# Patient Record
Sex: Female | Born: 1985 | Race: Black or African American | Hispanic: No | Marital: Married | Smoking: Never smoker
Health system: Southern US, Community
[De-identification: ages and names within clinical notes are randomized; demographics above are authoritative.]

## PROBLEM LIST (undated history)

## (undated) ENCOUNTER — Inpatient Hospital Stay (HOSPITAL_COMMUNITY): Payer: Self-pay

## (undated) DIAGNOSIS — Z789 Other specified health status: Secondary | ICD-10-CM

## (undated) HISTORY — PX: NO PAST SURGERIES: SHX2092

---

## 2003-12-17 ENCOUNTER — Emergency Department (HOSPITAL_COMMUNITY): Admission: EM | Admit: 2003-12-17 | Discharge: 2003-12-17 | Payer: Self-pay | Admitting: Emergency Medicine

## 2007-02-25 ENCOUNTER — Encounter: Admission: RE | Admit: 2007-02-25 | Discharge: 2007-02-25 | Payer: Self-pay | Admitting: Family Medicine

## 2011-10-08 LAB — OB RESULTS CONSOLE RUBELLA ANTIBODY, IGM: Rubella: IMMUNE

## 2011-10-15 LAB — OB RESULTS CONSOLE GC/CHLAMYDIA
Chlamydia: NEGATIVE
Gonorrhea: NEGATIVE

## 2012-02-03 ENCOUNTER — Inpatient Hospital Stay (HOSPITAL_COMMUNITY): Admission: AD | Admit: 2012-02-03 | Payer: Self-pay | Source: Ambulatory Visit | Admitting: Obstetrics and Gynecology

## 2012-02-20 NOTE — L&D Delivery Note (Signed)
Delivery Note At 6:00 PM a healthy female was delivered via Vaginal, Spontaneous Delivery (Presentation: Left Occiput Anterior).  1 loose Percy. APGAR: 9, 9; weight .   Placenta status: complete.  Cord: 3 vessels with the following complications: None.  Cord pH: not done  Anesthesia: Epidural  Episiotomy: None Lacerations: 2nd degree;Perineal Suture Repair: vicryl rapide Est. Blood Loss (mL): 350  Mom to postpartum.  Baby to nursery-stable.  Crystalina Stodghill,MARIE-LYNE 05/19/2012, 6:20 PM

## 2012-05-13 ENCOUNTER — Encounter (HOSPITAL_COMMUNITY): Payer: Self-pay | Admitting: *Deleted

## 2012-05-13 ENCOUNTER — Inpatient Hospital Stay (HOSPITAL_COMMUNITY)
Admission: AD | Admit: 2012-05-13 | Discharge: 2012-05-13 | Disposition: A | Discharge status: Home / Self Care | Payer: 59 | Source: Ambulatory Visit | Attending: Obstetrics and Gynecology | Admitting: Obstetrics and Gynecology

## 2012-05-13 DIAGNOSIS — R030 Elevated blood-pressure reading, without diagnosis of hypertension: Secondary | ICD-10-CM | POA: Insufficient documentation

## 2012-05-13 DIAGNOSIS — O99891 Other specified diseases and conditions complicating pregnancy: Secondary | ICD-10-CM | POA: Insufficient documentation

## 2012-05-13 HISTORY — DX: Other specified health status: Z78.9

## 2012-05-13 LAB — URINALYSIS, ROUTINE W REFLEX MICROSCOPIC
Glucose, UA: NEGATIVE mg/dL
Protein, ur: NEGATIVE mg/dL

## 2012-05-13 LAB — CBC
Platelets: 290 10*3/uL (ref 150–400)
RBC: 4.1 MIL/uL (ref 3.87–5.11)
WBC: 12.2 10*3/uL — ABNORMAL HIGH (ref 4.0–10.5)

## 2012-05-13 LAB — COMPREHENSIVE METABOLIC PANEL
AST: 20 U/L (ref 0–37)
Albumin: 2.6 g/dL — ABNORMAL LOW (ref 3.5–5.2)
Alkaline Phosphatase: 212 U/L — ABNORMAL HIGH (ref 39–117)
Chloride: 99 mEq/L (ref 96–112)
Potassium: 4.4 mEq/L (ref 3.5–5.1)
Total Bilirubin: 0.2 mg/dL — ABNORMAL LOW (ref 0.3–1.2)

## 2012-05-13 LAB — URINE MICROSCOPIC-ADD ON

## 2012-05-13 LAB — URIC ACID: Uric Acid, Serum: 5.3 mg/dL (ref 2.4–7.0)

## 2012-05-13 NOTE — MAU Note (Signed)
Pt was seen at the office and found out that her BP was elevated.Pt was sent to MAU for further evaluation

## 2012-05-13 NOTE — MAU Provider Note (Signed)
History    27 yo G1P0 MBF now @ 39 5/[redacted] weeks gestation presented for further evaluation after routine office visit showed BP 150/98, 30 proteinuria. (+0 3lb weight gain since last visit. Patient denied epigastric pain or visual disturbance (+) FM Pt sent for serial BP, PIH labs and monitoring  Chief Complaint  Patient presents with  . Hypertension     OB History   Grav Para Term Preterm Abortions TAB SAB Ect Mult Living   1               Past Medical History  Diagnosis Date  . Medical history non-contributory     Past Surgical History  Procedure Laterality Date  . No past surgeries      Family History  Problem Relation Age of Onset  . Diabetes Mother   . Asthma Father   . Hypertension Sister   . Fibroids Sister     History  Substance Use Topics  . Smoking status: Not on file  . Smokeless tobacco: Not on file  . Alcohol Use: Not on file    Allergies: No Known Allergies  Prescriptions prior to admission  Medication Sig Dispense Refill  . cephALEXin (KEFLEX) 250 MG capsule Take 250 mg by mouth at bedtime. Continue until delivery for infection      . Evening Primrose topical oil Place 1 applicator vaginally at bedtime.      . pantoprazole (PROTONIX) 40 MG tablet Take 40 mg by mouth at bedtime.      . Prenatal Vit-Fe Fumarate-FA (MULTIVITAMIN-PRENATAL) 27-0.8 MG TABS Take 1 tablet by mouth daily at 12 noon.         Physical Exam   Blood pressure 125/84, pulse 93, temperature 99 F (37.2 C), temperature source Oral, last menstrual period 08/10/2011.  No exam performed today, done in office. Tracing: baseline 140, (+) accels 160-165 moderate variability. Reactive  ED Course  A/P  Transient elevated BP P) PIH warning signs. Labor prec. F/u office 1 week MDM   Serita Kyle, MD 6:32 PM 05/13/2012

## 2012-05-14 LAB — URINE CULTURE: Colony Count: NO GROWTH

## 2012-05-19 ENCOUNTER — Inpatient Hospital Stay (HOSPITAL_COMMUNITY): Payer: 59 | Admitting: Anesthesiology

## 2012-05-19 ENCOUNTER — Encounter (HOSPITAL_COMMUNITY): Payer: Self-pay

## 2012-05-19 ENCOUNTER — Encounter (HOSPITAL_COMMUNITY): Payer: Self-pay | Admitting: Anesthesiology

## 2012-05-19 ENCOUNTER — Inpatient Hospital Stay (HOSPITAL_COMMUNITY)
Admission: AD | Admit: 2012-05-19 | Discharge: 2012-05-21 | DRG: 775 | Disposition: A | Discharge status: Home / Self Care | Payer: 59 | Source: Ambulatory Visit | Attending: Obstetrics and Gynecology | Admitting: Obstetrics and Gynecology

## 2012-05-19 DIAGNOSIS — O99892 Other specified diseases and conditions complicating childbirth: Secondary | ICD-10-CM | POA: Diagnosis present

## 2012-05-19 DIAGNOSIS — IMO0001 Reserved for inherently not codable concepts without codable children: Secondary | ICD-10-CM

## 2012-05-19 DIAGNOSIS — Z22330 Carrier of Group B streptococcus: Secondary | ICD-10-CM

## 2012-05-19 DIAGNOSIS — O139 Gestational [pregnancy-induced] hypertension without significant proteinuria, unspecified trimester: Secondary | ICD-10-CM | POA: Diagnosis present

## 2012-05-19 LAB — CBC
HCT: 34.8 % — ABNORMAL LOW (ref 36.0–46.0)
MCH: 28.4 pg (ref 26.0–34.0)
MCHC: 34.5 g/dL (ref 30.0–36.0)
MCV: 82.3 fL (ref 78.0–100.0)
Platelets: 308 10*3/uL (ref 150–400)
RBC: 4.23 MIL/uL (ref 3.87–5.11)
RDW: 15 % (ref 11.5–15.5)
WBC: 17 10*3/uL — ABNORMAL HIGH (ref 4.0–10.5)

## 2012-05-19 LAB — COMPREHENSIVE METABOLIC PANEL
ALT: 12 U/L (ref 0–35)
AST: 21 U/L (ref 0–37)
Albumin: 2.9 g/dL — ABNORMAL LOW (ref 3.5–5.2)
Calcium: 9.8 mg/dL (ref 8.4–10.5)
Chloride: 98 mEq/L (ref 96–112)
Creatinine, Ser: 0.68 mg/dL (ref 0.50–1.10)
Sodium: 133 mEq/L — ABNORMAL LOW (ref 135–145)

## 2012-05-19 LAB — OB RESULTS CONSOLE ABO/RH: RH Type: NEGATIVE

## 2012-05-19 LAB — URINALYSIS, DIPSTICK ONLY
Bilirubin Urine: NEGATIVE
Glucose, UA: NEGATIVE mg/dL
Ketones, ur: NEGATIVE mg/dL
Specific Gravity, Urine: <1.005 — ABNORMAL LOW (ref 1.005–1.030)
pH: 6 (ref 5.0–8.0)

## 2012-05-19 LAB — RPR: RPR Ser Ql: NONREACTIVE

## 2012-05-19 LAB — TYPE AND SCREEN: Antibody Screen: NEGATIVE

## 2012-05-19 LAB — OB RESULTS CONSOLE ANTIBODY SCREEN: Antibody Screen: NEGATIVE

## 2012-05-19 MED ORDER — EPHEDRINE 5 MG/ML INJ
10.0000 mg | INTRAVENOUS | Status: DC | PRN
  Filled 2012-05-19: qty 2

## 2012-05-19 MED ORDER — LANOLIN HYDROUS EX OINT: TOPICAL_OINTMENT | CUTANEOUS | Status: DC | PRN

## 2012-05-19 MED ORDER — FLEET ENEMA 7-19 GM/118ML RE ENEM: 1.0000 | ENEMA | RECTAL | Status: DC | PRN

## 2012-05-19 MED ORDER — OXYTOCIN 40 UNITS IN LACTATED RINGERS INFUSION - SIMPLE MED
62.5000 mL/h | INTRAVENOUS | Status: DC
  Administered 2012-05-19: 62.5 mL/h via INTRAVENOUS
  Filled 2012-05-19: qty 1000

## 2012-05-19 MED ORDER — DIPHENHYDRAMINE HCL 50 MG/ML IJ SOLN: 12.5000 mg | INTRAMUSCULAR | Status: DC | PRN

## 2012-05-19 MED ORDER — IBUPROFEN 600 MG PO TABS
600.0000 mg | ORAL_TABLET | Freq: Four times a day (QID) | ORAL | Status: DC
  Administered 2012-05-20 – 2012-05-21 (×7): 600 mg via ORAL
  Filled 2012-05-19 (×8): qty 1

## 2012-05-19 MED ORDER — DIPHENHYDRAMINE HCL 25 MG PO CAPS
25.0000 mg | ORAL_CAPSULE | Freq: Four times a day (QID) | ORAL | Status: DC | PRN
  Filled 2012-05-19: qty 1

## 2012-05-19 MED ORDER — TETANUS-DIPHTH-ACELL PERTUSSIS 5-2.5-18.5 LF-MCG/0.5 IM SUSP: 0.5000 mL | Freq: Once | INTRAMUSCULAR | Status: DC

## 2012-05-19 MED ORDER — PHENYLEPHRINE 40 MCG/ML (10ML) SYRINGE FOR IV PUSH (FOR BLOOD PRESSURE SUPPORT)
80.0000 ug | PREFILLED_SYRINGE | INTRAVENOUS | Status: DC | PRN
  Filled 2012-05-19: qty 2

## 2012-05-19 MED ORDER — LACTATED RINGERS IV SOLN: 500.0000 mL | INTRAVENOUS | Status: DC | PRN

## 2012-05-19 MED ORDER — LIDOCAINE HCL (PF) 1 % IJ SOLN
30.0000 mL | INTRAMUSCULAR | Status: AC | PRN
  Administered 2012-05-19: 30 mL via SUBCUTANEOUS
  Filled 2012-05-19 (×2): qty 30

## 2012-05-19 MED ORDER — ACETAMINOPHEN 325 MG PO TABS: 650.0000 mg | ORAL_TABLET | ORAL | Status: DC | PRN

## 2012-05-19 MED ORDER — ONDANSETRON HCL 4 MG/2ML IJ SOLN: 4.0000 mg | Freq: Four times a day (QID) | INTRAMUSCULAR | Status: DC | PRN

## 2012-05-19 MED ORDER — BUTORPHANOL TARTRATE 1 MG/ML IJ SOLN
1.0000 mg | Freq: Once | INTRAMUSCULAR | Status: AC
  Administered 2012-05-19: 1 mg via INTRAVENOUS
  Filled 2012-05-19: qty 1

## 2012-05-19 MED ORDER — LACTATED RINGERS IV SOLN
INTRAVENOUS | Status: DC
  Administered 2012-05-19: 11:00:00 via INTRAVENOUS

## 2012-05-19 MED ORDER — LACTATED RINGERS IV SOLN: 500.0000 mL | Freq: Once | INTRAVENOUS | Status: DC

## 2012-05-19 MED ORDER — OXYCODONE-ACETAMINOPHEN 5-325 MG PO TABS: 1.0000 | ORAL_TABLET | ORAL | Status: DC | PRN

## 2012-05-19 MED ORDER — EPHEDRINE 5 MG/ML INJ
10.0000 mg | INTRAVENOUS | Status: DC | PRN
  Filled 2012-05-19: qty 2
  Filled 2012-05-19: qty 4

## 2012-05-19 MED ORDER — SIMETHICONE 80 MG PO CHEW: 80.0000 mg | CHEWABLE_TABLET | ORAL | Status: DC | PRN

## 2012-05-19 MED ORDER — OXYTOCIN BOLUS FROM INFUSION: 500.0000 mL | INTRAVENOUS | Status: DC

## 2012-05-19 MED ORDER — OXYTOCIN 40 UNITS IN LACTATED RINGERS INFUSION - SIMPLE MED: 62.5000 mL/h | INTRAVENOUS | Status: DC | PRN

## 2012-05-19 MED ORDER — FENTANYL 2.5 MCG/ML BUPIVACAINE 1/10 % EPIDURAL INFUSION (WH - ANES)
14.0000 mL/h | INTRAMUSCULAR | Status: DC | PRN
  Administered 2012-05-19: 14 mL/h via EPIDURAL
  Filled 2012-05-19: qty 125

## 2012-05-19 MED ORDER — LIDOCAINE HCL (PF) 1 % IJ SOLN
INTRAMUSCULAR | Status: DC | PRN
  Administered 2012-05-19 (×2): 5 mL

## 2012-05-19 MED ORDER — TERBUTALINE SULFATE 1 MG/ML IJ SOLN: 0.2500 mg | Freq: Once | INTRAMUSCULAR | Status: DC | PRN

## 2012-05-19 MED ORDER — ONDANSETRON HCL 4 MG PO TABS: 4.0000 mg | ORAL_TABLET | ORAL | Status: DC | PRN

## 2012-05-19 MED ORDER — OXYTOCIN 40 UNITS IN LACTATED RINGERS INFUSION - SIMPLE MED
1.0000 m[IU]/min | INTRAVENOUS | Status: DC
  Administered 2012-05-19: 2 m[IU]/min via INTRAVENOUS
  Administered 2012-05-19: 6 m[IU]/min via INTRAVENOUS

## 2012-05-19 MED ORDER — DIBUCAINE 1 % RE OINT: 1.0000 "application " | TOPICAL_OINTMENT | RECTAL | Status: DC | PRN

## 2012-05-19 MED ORDER — CITRIC ACID-SODIUM CITRATE 334-500 MG/5ML PO SOLN: 30.0000 mL | ORAL | Status: DC | PRN

## 2012-05-19 MED ORDER — WITCH HAZEL-GLYCERIN EX PADS: 1.0000 "application " | MEDICATED_PAD | CUTANEOUS | Status: DC | PRN

## 2012-05-19 MED ORDER — IBUPROFEN 600 MG PO TABS: 600.0000 mg | ORAL_TABLET | Freq: Four times a day (QID) | ORAL | Status: DC | PRN

## 2012-05-19 MED ORDER — PENICILLIN G POTASSIUM 5000000 UNITS IJ SOLR
5.0000 10*6.[IU] | Freq: Once | INTRAVENOUS | Status: AC
  Administered 2012-05-19: 5 10*6.[IU] via INTRAVENOUS
  Filled 2012-05-19: qty 5

## 2012-05-19 MED ORDER — PRENATAL MULTIVITAMIN CH
1.0000 | ORAL_TABLET | Freq: Every day | ORAL | Status: DC
  Administered 2012-05-20 – 2012-05-21 (×2): 1 via ORAL
  Filled 2012-05-19 (×3): qty 1

## 2012-05-19 MED ORDER — PENICILLIN G POTASSIUM 5000000 UNITS IJ SOLR
2.5000 10*6.[IU] | INTRAVENOUS | Status: DC
  Administered 2012-05-19 (×2): 2.5 10*6.[IU] via INTRAVENOUS
  Filled 2012-05-19 (×5): qty 2.5

## 2012-05-19 MED ORDER — PHENYLEPHRINE 40 MCG/ML (10ML) SYRINGE FOR IV PUSH (FOR BLOOD PRESSURE SUPPORT)
80.0000 ug | PREFILLED_SYRINGE | INTRAVENOUS | Status: DC | PRN
  Filled 2012-05-19: qty 5
  Filled 2012-05-19: qty 2

## 2012-05-19 MED ORDER — ZOLPIDEM TARTRATE 5 MG PO TABS: 5.0000 mg | ORAL_TABLET | Freq: Every evening | ORAL | Status: DC | PRN

## 2012-05-19 MED ORDER — BENZOCAINE-MENTHOL 20-0.5 % EX AERO: 1.0000 "application " | INHALATION_SPRAY | CUTANEOUS | Status: DC | PRN

## 2012-05-19 MED ORDER — SENNOSIDES-DOCUSATE SODIUM 8.6-50 MG PO TABS
2.0000 | ORAL_TABLET | Freq: Every day | ORAL | Status: DC
  Administered 2012-05-20: 2 via ORAL

## 2012-05-19 MED ORDER — ONDANSETRON HCL 4 MG/2ML IJ SOLN: 4.0000 mg | INTRAMUSCULAR | Status: DC | PRN

## 2012-05-19 NOTE — H&P (Signed)
Christina Wu is a 27 y.o. female G1 at 40.3 wks, well dated by LMP c/w sono. Here in active labor, contracting for a few days but stronger contractions since midnight. No bleeding or leaking per vagina. Good FMs.  [facility]- Dr Cherly Hensen.  Started [facility] in 1st trim, all labs normal, sono normal, Rh(-), Rhogam at 28 wks. GERD (Pantoprazole), GBS(+) Was sent from office last week to MAU for HTN evaluation, Preeclampsia ruled out. Pt denies HA/ vision changes/ RUQ or epigastric pain/ LE swelling or abn weight gain in last few wks.  History OB History   Grav Para Term Preterm Abortions TAB SAB Ect Mult Living   1 0 0 0 0 0 0 0 0 0      Past Medical History  Diagnosis Date  . Medical history non-contributory    Past Surgical History  Procedure Laterality Date  . No past surgeries     Family History: family history includes Asthma in her father; Diabetes in her mother; Fibroids in her sister; and Hypertension in her sister. Social History:  reports that she has never smoked. She does not have any smokeless tobacco history on file. She reports that she does not drink alcohol or use illicit drugs.   Prenatal Transfer Tool  Maternal Diabetes: No Genetic Screening: Normal AFP4 neg Maternal Ultrasounds/Referrals: Normal Fetal Ultrasounds or other Referrals:  None Maternal Substance Abuse:  No Significant Maternal Medications:  Meds include: Protonix Significant Maternal Lab Results:  Lab values include: Group B Strep positive, Rh negative  ROS denies HA/ vision changes/ RUQ or epigastric pain/ LE swelling or abn weight gain in last few wks.  Exam Physical Exam  Blood pressure 125/94, pulse 77, temperature 98.8 F (37.1 C), temperature source Oral, resp. rate 18, height 5\' 1"  (1.549 m), weight 211 lb 6.4 oz (95.89 kg). A&O x 3, no acute distress. Pleasant HEENT neg, no thyromegaly Lungs CTA bilat CV RRR, A1S2 normal Abdo soft, non tender, non acute Extr no edema/ tenderness Pelvic  above, is intact, will AROM after 2nd PCN infused. Dilation: 6, Effacement (%): 80, Station: -2 , Exam by:: Rudi Coco RN FHT 140s/ + accels/ no decels/ moderate variability/ reassuring Toco every 3-4 min, spontaneous.   Prenatal labs: ABO, Rh: --/--/O NEG (03/31 0548), Rhogam at 28 wks Antibody: NEG (03/31 0548) Rubella: Immune (08/19 0000) RPR: Nonreactive (08/19 0000)  HBsAg: Negative (08/19 0000)  HIV: Non-reactive (08/19 0000)  GBS: Positive (03/31 0000)  Glucola normal AFP4 neg Anatomy sono normal  Assessment/Plan: 27 yo, G1 at 40.3 wks, GBS(+), on PCN, Gest.HTN, check Preeclampsia labs again incl UA. No symptoms at present, desires IV pain meds for labor pains, Epidural if pain worse. Rh neg, Rhogam PP as indicated.  FHT category I, anticipate SVD, EFW 8 lbs  Christina Wu R 05/19/2012, 10:08 AM

## 2012-05-19 NOTE — Progress Notes (Addendum)
Christina Wu is a 27 y.o. G1P0000 at [redacted]w[redacted]d, admitted in active labor. GBS(+), 2nd PCN given.  S/p Epidural, comfortable now.  BP elevated, denies PEC s/s   Objective: BP 142/89  Pulse 98  Temp(Src) 98.6 F (37 C) (Oral)  Resp 20  Ht 5\' 1"  (1.549 m)  Wt 211 lb 6.4 oz (95.89 kg)  BMI 39.96 kg/m2  SpO2 97%    FHT:  FHR: 125 bpm, variability: moderate,  accelerations:  Present,  decelerations:  Absent UC:   regular, every 3 minutes SVE:   Dilation: 7.5 Effacement (%): 90 Station: -3 Exam by:: [employer] high, is OA. Continue to assess descent.   Labs: Lab Results  Component Value Date   WBC 17.0* 05/19/2012   HGB 12.0 05/19/2012   HCT 34.8* 05/19/2012   MCV 82.3 05/19/2012   PLT 308 05/19/2012  PIH labs pending.  Assessment / Plan: Spontaneous labor, progressing normally, but station is high. Assess progress especially descent.   Labor: Progressing normally Preeclampsia:  none Fetal Wellbeing:  Category I Pain Control:  Epidural Anticipated MOD:  NSVD, but guarded since station at -3.    Ruchi Stoney R 05/19/2012, 11:16 AM  PIH labs normal, urine protein -negative V.Juliene Pina, MD

## 2012-05-19 NOTE — Progress Notes (Signed)
Subjective: Doing well, pain controled by epidural, UCs q3 min  Anesthesia epidural   Objective: BP 144/89  Pulse 73  Temp(Src) 97.7 F (36.5 C) (Oral)  Resp 20  Ht 5\' 1"  (1.549 m)  Wt 95.89 kg (211 lb 6.4 oz)  BMI 39.96 kg/m2  SpO2 99%   FHT:  FHR: 130's bpm, variability: moderate,  accelerations:  Present,  decelerations:  Present occasional mild variable decelerations UC:   regular, every 3 minutes VE:   Dilation: 10 Effacement (%): 100 Station: +2 Exam by:: Tianne Plott Presentation turned digitally from Rt post occiput to Rt ant occiput   Assessment / Plan: 2nd phase of labor.  Pushing well.  Fetal Wellbeing:  Category I Pain Control:  Epidural  Anticipated MOD:  NSVD  Christian Borgerding,MARIE-LYNE 05/19/2012, 5:22 PM

## 2012-05-19 NOTE — Anesthesia Preprocedure Evaluation (Signed)
Anesthesia Evaluation  Patient identified by MRN, date of birth, ID band Patient awake    Reviewed: Allergy & Precautions, H&P , Patient's Chart, lab work & pertinent test results  Airway Mallampati: III TM Distance: >3 FB Neck ROM: full    Dental no notable dental hx.    Pulmonary neg pulmonary ROS,  breath sounds clear to auscultation  Pulmonary exam normal       Cardiovascular negative cardio ROS  Rhythm:regular Rate:Normal     Neuro/Psych negative neurological ROS  negative psych ROS   GI/Hepatic negative GI ROS, Neg liver ROS,   Endo/Other  negative endocrine ROSMorbid obesity  Renal/GU negative Renal ROS     Musculoskeletal   Abdominal   Peds  Hematology negative hematology ROS (+)   Anesthesia Other Findings   Reproductive/Obstetrics (+) Pregnancy                           Anesthesia Physical Anesthesia Plan  ASA: III  Anesthesia Plan: Epidural   Post-op Pain Management:    Induction:   Airway Management Planned:   Additional Equipment:   Intra-op Plan:   Post-operative Plan:   Informed Consent: I have reviewed the patients History and Physical, chart, labs and discussed the procedure including the risks, benefits and alternatives for the proposed anesthesia with the patient or authorized representative who has indicated his/her understanding and acceptance.     Plan Discussed with:   Anesthesia Plan Comments:         Anesthesia Quick Evaluation  

## 2012-05-19 NOTE — Anesthesia Procedure Notes (Signed)
Epidural Patient location during procedure: OB Start time: 05/19/2012 10:45 AM  Staffing Anesthesiologist: Angus Seller., Harrell Gave. Performed by: anesthesiologist   Preanesthetic Checklist Completed: patient identified, site marked, surgical consent, pre-op evaluation, timeout performed, IV checked, risks and benefits discussed and monitors and equipment checked  Epidural Patient position: sitting Prep: site prepped and draped and DuraPrep Patient monitoring: continuous pulse ox and blood pressure Approach: midline Injection technique: LOR air and LOR saline  Needle:  Needle type: Tuohy  Needle gauge: 17 G Needle length: 9 cm and 9 Needle insertion depth: 7 cm Catheter type: closed end flexible Catheter size: 19 Gauge Catheter at skin depth: 14 cm Test dose: negative  Assessment Events: blood not aspirated, injection not painful, no injection resistance, negative IV test and no paresthesia  Additional Notes Patient identified.  Risk benefits discussed including failed block, incomplete pain control, headache, nerve damage, paralysis, blood pressure changes, nausea, vomiting, reactions to medication both toxic or allergic, and postpartum back pain.  Patient expressed understanding and wished to proceed.  All questions were answered.  Sterile technique used throughout procedure and epidural site dressed with sterile barrier dressing. No paresthesia or other complications noted.The patient did not experience any signs of intravascular injection such as tinnitus or metallic taste in mouth nor signs of intrathecal spread such as rapid motor block. Please see nursing notes for vital signs.

## 2012-05-19 NOTE — Progress Notes (Signed)
Christina Wu is a 27 y.o. G1P0000 at [redacted]w[redacted]d in active labor. COmfortable with epidural  Objective: BP 130/72  Pulse 88  Temp(Src) 98.5 F (36.9 C) (Oral)  Resp 20  Ht 5\' 1"  (1.549 m)  Wt 211 lb 6.4 oz (95.89 kg)  BMI 39.96 kg/m2  SpO2 99%    FHT:  FHR: 125 bpm, variability: moderate,  accelerations:  Present,  decelerations:  Present occ variable decels UC:   irregular, every 3-5 minutes with coupling. Pitocin started at 11.30 am due to protracted active phase SVE:   Dilation: 8 Effacement (%): 90 Station: -2 Exam by:: [employer] OT now, will place in exaggerated Sims.   Labs: Lab Results  Component Value Date   WBC 17.0* 05/19/2012   HGB 12.0 05/19/2012   HCT 34.8* 05/19/2012   MCV 82.3 05/19/2012   PLT 308 05/19/2012   Assessment / Plan: Protracted active phase, Pitocin per protocol. PCN for GBS, s/p 2 doses, will continue q 4hrs  Labor: Protracted, with high station, reassess with pitocin on board Preeclampsia:  No evidence, BPs stable, no anti-HTN med given, labs nl.  Fetal Wellbeing:  Category I Pain Control:  Epidural Anticipated MOD:  Guarded for vaginal delivery since station remains high, continue pitocin and assess progress. Patient understands and has no questions.   Teasha Murrillo R 05/19/2012, 1:13 PM

## 2012-05-20 LAB — CBC
HCT: 30 % — ABNORMAL LOW (ref 36.0–46.0)
MCH: 27.9 pg (ref 26.0–34.0)
MCHC: 33.7 g/dL (ref 30.0–36.0)
MCV: 82.9 fL (ref 78.0–100.0)
Platelets: 256 10*3/uL (ref 150–400)
RDW: 15.3 % (ref 11.5–15.5)
WBC: 22.4 10*3/uL — ABNORMAL HIGH (ref 4.0–10.5)

## 2012-05-20 NOTE — Progress Notes (Signed)
PPD 1 SVD  S:  Reports feeling tired and sore             Tolerating po/ No nausea or vomiting             Bleeding is light             Pain controlled with motrin and percocet             Up ad lib / ambulatory / voiding QS  Newborn breast feeding  / Circumcision planned O:               VS: BP 133/88  Pulse 71  Temp(Src) 97.8 F (36.6 C) (Oral)  Resp 18  Ht 5\' 1"  (1.549 m)  Wt 95.89 kg (211 lb 6.4 oz)  BMI 39.96 kg/m2  SpO2 99%   LABS:  Recent Labs  05/19/12 0558 05/20/12 0605  WBC 17.0* 22.4*  HGB 12.0 10.1*  PLT 308 256    I&O: Intake/Output     03/31 0701 - 04/01 0700 04/01 0701 - 04/02 0700   Urine (mL/kg/hr) 700 (0.3)    Blood 350 (0.2)    Total Output 1050     Net -1050                       Physical Exam:             Alert and oriented X3  Lungs: Clear and unlabored  Heart: regular rate and rhythm / no mumurs  Abdomen: soft, non-tender, non-distended              Fundus: firm, non-tender, U+1 (needs to void / 4+ hours since last void)  Perineum: mild edema / ice in place  Lochia: light  Extremities: trace edema, no calf pain or tenderness   A: PPD # 1   Doing well - stable status  P:  Routine post partum orders             Encouraged void every 2-3 hours prior to urge    Marlinda Mike CNM, MSN 05/20/2012, 9:05 AM

## 2012-05-20 NOTE — Anesthesia Postprocedure Evaluation (Signed)
  Anesthesia Post-op Note  Patient: Research scientist (physical sciences)  Procedure(s) Performed: * No procedures listed *  Patient Location: Mother/Baby  Anesthesia Type:Epidural  Level of Consciousness: awake, alert , oriented and patient cooperative  Airway and Oxygen Therapy: Patient Spontanous Breathing  Post-op Pain: mild  Post-op Assessment: Patient's Cardiovascular Status Stable, Respiratory Function Stable, Patent Airway, No signs of Nausea or vomiting, Adequate PO intake and Pain level controlled  Post-op Vital Signs: Reviewed and stable  Complications: No apparent anesthesia complications

## 2012-05-21 MED ORDER — IBUPROFEN 600 MG PO TABS: 600.0000 mg | ORAL_TABLET | Freq: Four times a day (QID) | ORAL | Status: DC

## 2012-05-21 MED ORDER — RHO D IMMUNE GLOBULIN 1500 UNIT/2ML IJ SOLN
300.0000 ug | Freq: Once | INTRAMUSCULAR | Status: AC
  Administered 2012-05-21: 300 ug via INTRAMUSCULAR
  Filled 2012-05-21: qty 2

## 2012-05-21 MED ORDER — OXYCODONE-ACETAMINOPHEN 5-325 MG PO TABS: 1.0000 | ORAL_TABLET | ORAL | Status: DC | PRN

## 2012-05-21 NOTE — Discharge Summary (Signed)
Obstetric Discharge Summary  Reason for Admission: onset of labor Prenatal Procedures: none Intrapartum Procedures: spontaneous vaginal delivery and GBS prophylaxis Postpartum Procedures: none Complications-Operative and Postpartum: 2nd degree perineal laceration Hemoglobin  Date Value Range Status  05/20/2012 10.1* 12.0 - 15.0 g/dL Final     HCT  Date Value Range Status  05/20/2012 30.0* 36.0 - 46.0 % Final    Physical Exam:  General: alert, cooperative and no distress Lochia: appropriate Uterine Fundus: firm Incision: healing well DVT Evaluation: No evidence of DVT seen on physical exam.  Discharge Diagnoses: Term Pregnancy-delivered  Discharge Information: Date: 05/21/2012 Activity: pelvic rest Diet: routine Medications: PNV, Ibuprofen and Percocet Condition: stable Instructions: refer to practice specific booklet Discharge to: home Follow-up Information   Follow up with COUSINS,SHERONETTE A, MD. Schedule an appointment as soon as possible for a visit in 6 weeks.   Contact information:   [address] [place] [address] [phone]       Newborn Data: Live born female  Birth Weight: 6 lb 6.8 oz (2915 g) APGAR: 9, 9  Home with mother.  Christina Wu 05/21/2012, 8:28 AM

## 2012-05-21 NOTE — Progress Notes (Signed)
PPD 2 SVD  S:  Reports feeling really tired - BF all night             Tolerating po/ No nausea or vomiting             Bleeding is light             Pain controlled with motrin and percocet             Up ad lib / ambulatory / voiding QS  Newborn breast feeding  / Circumcision done this am  O:               VS: BP 136/84  Pulse 101  Temp(Src) 98.2 F (36.8 C) (Oral)  Resp 18  Ht 5\' 1"  (1.549 m)  Wt 95.89 kg (211 lb 6.4 oz)  BMI 39.96 kg/m2  SpO2 99%   LABS:  Recent Labs  05/19/12 0558 05/20/12 0605  WBC 17.0* 22.4*  HGB 12.0 10.1*  PLT 308 256                Physical Exam:             Alert and oriented X3  Abdomen: soft, non-tender, non-distended              Fundus: firm, non-tender, U-1  Perineum: mild edema  Lochia: light  Extremities: no edema, no calf pain or tenderness    A: PPD # 2   Doing well - stable status  P:  Routine post partum orders  Discharge home - [facility] booklet - instructions reviewed  Marlinda Mike CNM, MSN 05/21/2012, 8:25 AM

## 2012-05-22 LAB — RH IG WORKUP (INCLUDES ABO/RH)
Fetal Screen: NEGATIVE
Unit division: 0

## 2013-12-21 ENCOUNTER — Encounter (HOSPITAL_COMMUNITY): Payer: Self-pay

## 2015-10-12 ENCOUNTER — Other Ambulatory Visit: Payer: Self-pay | Admitting: Internal Medicine

## 2015-10-12 DIAGNOSIS — N63 Unspecified lump in unspecified breast: Secondary | ICD-10-CM

## 2015-10-18 ENCOUNTER — Other Ambulatory Visit: Payer: Self-pay

## 2015-12-14 LAB — OB RESULTS CONSOLE ABO/RH: RH Type: NEGATIVE

## 2015-12-14 LAB — OB RESULTS CONSOLE RUBELLA ANTIBODY, IGM: RUBELLA: IMMUNE

## 2015-12-14 LAB — OB RESULTS CONSOLE HIV ANTIBODY (ROUTINE TESTING): HIV: NONREACTIVE

## 2015-12-14 LAB — OB RESULTS CONSOLE RPR: RPR: NONREACTIVE

## 2015-12-14 LAB — OB RESULTS CONSOLE HEPATITIS B SURFACE ANTIGEN: Hepatitis B Surface Ag: NEGATIVE

## 2015-12-14 LAB — OB RESULTS CONSOLE ANTIBODY SCREEN: ANTIBODY SCREEN: NEGATIVE

## 2015-12-28 LAB — OB RESULTS CONSOLE GBS: STREP GROUP B AG: POSITIVE

## 2015-12-28 LAB — OB RESULTS CONSOLE GC/CHLAMYDIA
CHLAMYDIA, DNA PROBE: NEGATIVE
GC PROBE AMP, GENITAL: NEGATIVE

## 2016-02-20 NOTE — L&D Delivery Note (Signed)
Delivery Note At 3:44 PM a viable and healthy female was delivered via Vaginal, Spontaneous Delivery (Presentation:vtx;ROA  ).  APGAR: 7, 9; weight  7lb 14 oz .   Placenta status: spontaneous intact , .  Cord:  with the following complications:none .  Cord pH: none Shoulder dystocia resolved with mcRoberts, suprapubic pressure Anesthesia:  epidural Episiotomy: None Lacerations: 2nd degree perineal Suture Repair: 3.0 chromic Est. Blood Loss (mL): 300  Mom to postpartum.  Baby to Couplet care / Skin to Skin.  Christina Wu A 07/18/2016, 5:02 PM

## 2016-07-18 ENCOUNTER — Inpatient Hospital Stay (HOSPITAL_COMMUNITY): Payer: 59 | Admitting: Anesthesiology

## 2016-07-18 ENCOUNTER — Inpatient Hospital Stay (HOSPITAL_COMMUNITY)
Admission: AD | Admit: 2016-07-18 | Discharge: 2016-07-20 | DRG: 775 | Disposition: A | Discharge status: Home / Self Care | Payer: 59 | Source: Ambulatory Visit | Attending: Obstetrics and Gynecology | Admitting: Obstetrics and Gynecology

## 2016-07-18 ENCOUNTER — Encounter (HOSPITAL_COMMUNITY): Payer: Self-pay

## 2016-07-18 DIAGNOSIS — Z3493 Encounter for supervision of normal pregnancy, unspecified, third trimester: Secondary | ICD-10-CM | POA: Diagnosis present

## 2016-07-18 DIAGNOSIS — Z6841 Body Mass Index (BMI) 40.0 and over, adult: Secondary | ICD-10-CM | POA: Diagnosis not present

## 2016-07-18 DIAGNOSIS — O99824 Streptococcus B carrier state complicating childbirth: Principal | ICD-10-CM | POA: Diagnosis present

## 2016-07-18 DIAGNOSIS — Z6791 Unspecified blood type, Rh negative: Secondary | ICD-10-CM

## 2016-07-18 DIAGNOSIS — Z3A39 39 weeks gestation of pregnancy: Secondary | ICD-10-CM | POA: Diagnosis not present

## 2016-07-18 DIAGNOSIS — O26893 Other specified pregnancy related conditions, third trimester: Secondary | ICD-10-CM | POA: Diagnosis present

## 2016-07-18 DIAGNOSIS — O99214 Obesity complicating childbirth: Secondary | ICD-10-CM | POA: Diagnosis present

## 2016-07-18 LAB — CBC
HEMATOCRIT: 37.3 % (ref 36.0–46.0)
Hemoglobin: 13 g/dL (ref 12.0–15.0)
MCH: 30.5 pg (ref 26.0–34.0)
MCHC: 34.9 g/dL (ref 30.0–36.0)
MCV: 87.6 fL (ref 78.0–100.0)
Platelets: 250 10*3/uL (ref 150–400)
RBC: 4.26 MIL/uL (ref 3.87–5.11)
RDW: 13.8 % (ref 11.5–15.5)
WBC: 13.4 10*3/uL — ABNORMAL HIGH (ref 4.0–10.5)

## 2016-07-18 LAB — RPR: RPR Ser Ql: NONREACTIVE

## 2016-07-18 LAB — TYPE AND SCREEN
ABO/RH(D): O NEG
Antibody Screen: NEGATIVE

## 2016-07-18 MED ORDER — EPHEDRINE 5 MG/ML INJ
10.0000 mg | INTRAVENOUS | Status: DC | PRN
  Filled 2016-07-18: qty 2

## 2016-07-18 MED ORDER — PHENYLEPHRINE 40 MCG/ML (10ML) SYRINGE FOR IV PUSH (FOR BLOOD PRESSURE SUPPORT)
80.0000 ug | PREFILLED_SYRINGE | INTRAVENOUS | Status: DC | PRN
  Filled 2016-07-18: qty 10
  Filled 2016-07-18: qty 5

## 2016-07-18 MED ORDER — FERROUS SULFATE 325 (65 FE) MG PO TABS
325.0000 mg | ORAL_TABLET | Freq: Two times a day (BID) | ORAL | Status: DC
  Administered 2016-07-19: 325 mg via ORAL
  Filled 2016-07-18: qty 1

## 2016-07-18 MED ORDER — SENNOSIDES-DOCUSATE SODIUM 8.6-50 MG PO TABS: 2.0000 | ORAL_TABLET | ORAL | Status: DC

## 2016-07-18 MED ORDER — COCONUT OIL OIL: 1.0000 "application " | TOPICAL_OIL | Status: DC | PRN

## 2016-07-18 MED ORDER — ONDANSETRON HCL 4 MG PO TABS: 4.0000 mg | ORAL_TABLET | ORAL | Status: DC | PRN

## 2016-07-18 MED ORDER — ONDANSETRON HCL 4 MG/2ML IJ SOLN: 4.0000 mg | INTRAMUSCULAR | Status: DC | PRN

## 2016-07-18 MED ORDER — FENTANYL 2.5 MCG/ML BUPIVACAINE 1/10 % EPIDURAL INFUSION (WH - ANES)
14.0000 mL/h | INTRAMUSCULAR | Status: DC | PRN
  Administered 2016-07-18: 14 mL/h via EPIDURAL
  Filled 2016-07-18: qty 100

## 2016-07-18 MED ORDER — OXYCODONE-ACETAMINOPHEN 5-325 MG PO TABS: 2.0000 | ORAL_TABLET | ORAL | Status: DC | PRN

## 2016-07-18 MED ORDER — ONDANSETRON HCL 4 MG/2ML IJ SOLN: 4.0000 mg | Freq: Four times a day (QID) | INTRAMUSCULAR | Status: DC | PRN

## 2016-07-18 MED ORDER — OXYTOCIN 40 UNITS IN LACTATED RINGERS INFUSION - SIMPLE MED: 2.5000 [IU]/h | INTRAVENOUS | Status: DC

## 2016-07-18 MED ORDER — LACTATED RINGERS IV SOLN: 500.0000 mL | INTRAVENOUS | Status: DC | PRN

## 2016-07-18 MED ORDER — OXYCODONE-ACETAMINOPHEN 5-325 MG PO TABS: 1.0000 | ORAL_TABLET | ORAL | Status: DC | PRN

## 2016-07-18 MED ORDER — ACETAMINOPHEN 325 MG PO TABS: 650.0000 mg | ORAL_TABLET | ORAL | Status: DC | PRN

## 2016-07-18 MED ORDER — DIPHENHYDRAMINE HCL 25 MG PO CAPS: 25.0000 mg | ORAL_CAPSULE | Freq: Four times a day (QID) | ORAL | Status: DC | PRN

## 2016-07-18 MED ORDER — IBUPROFEN 600 MG PO TABS
600.0000 mg | ORAL_TABLET | Freq: Four times a day (QID) | ORAL | Status: DC
  Administered 2016-07-19 – 2016-07-20 (×6): 600 mg via ORAL
  Filled 2016-07-18 (×7): qty 1

## 2016-07-18 MED ORDER — PRENATAL MULTIVITAMIN CH
1.0000 | ORAL_TABLET | Freq: Every day | ORAL | Status: DC
  Administered 2016-07-19 – 2016-07-20 (×2): 1 via ORAL
  Filled 2016-07-18 (×2): qty 1

## 2016-07-18 MED ORDER — LACTATED RINGERS IV SOLN
500.0000 mL | Freq: Once | INTRAVENOUS | Status: AC
  Administered 2016-07-18: 500 mL via INTRAVENOUS

## 2016-07-18 MED ORDER — OXYCODONE HCL 5 MG PO TABS: 10.0000 mg | ORAL_TABLET | ORAL | Status: DC | PRN

## 2016-07-18 MED ORDER — BENZOCAINE-MENTHOL 20-0.5 % EX AERO
1.0000 "application " | INHALATION_SPRAY | CUTANEOUS | Status: DC | PRN
  Filled 2016-07-18: qty 56

## 2016-07-18 MED ORDER — WITCH HAZEL-GLYCERIN EX PADS: 1.0000 "application " | MEDICATED_PAD | CUTANEOUS | Status: DC | PRN

## 2016-07-18 MED ORDER — DIBUCAINE 1 % RE OINT: 1.0000 "application " | TOPICAL_OINTMENT | RECTAL | Status: DC | PRN

## 2016-07-18 MED ORDER — OXYTOCIN 40 UNITS IN LACTATED RINGERS INFUSION - SIMPLE MED
1.0000 m[IU]/min | INTRAVENOUS | Status: DC
  Administered 2016-07-18: 2 m[IU]/min via INTRAVENOUS
  Filled 2016-07-18: qty 1000

## 2016-07-18 MED ORDER — FLEET ENEMA 7-19 GM/118ML RE ENEM: 1.0000 | ENEMA | RECTAL | Status: DC | PRN

## 2016-07-18 MED ORDER — BUTORPHANOL TARTRATE 2 MG/ML IJ SOLN: 2.0000 mg | INTRAMUSCULAR | Status: DC | PRN

## 2016-07-18 MED ORDER — LIDOCAINE HCL (PF) 1 % IJ SOLN
30.0000 mL | INTRAMUSCULAR | Status: DC | PRN
  Filled 2016-07-18: qty 30

## 2016-07-18 MED ORDER — DIPHENHYDRAMINE HCL 50 MG/ML IJ SOLN: 12.5000 mg | INTRAMUSCULAR | Status: DC | PRN

## 2016-07-18 MED ORDER — TERBUTALINE SULFATE 1 MG/ML IJ SOLN
0.2500 mg | Freq: Once | INTRAMUSCULAR | Status: DC | PRN
  Filled 2016-07-18: qty 1

## 2016-07-18 MED ORDER — PENICILLIN G POT IN DEXTROSE 60000 UNIT/ML IV SOLN
3.0000 10*6.[IU] | INTRAVENOUS | Status: DC
  Administered 2016-07-18 (×3): 3 10*6.[IU] via INTRAVENOUS
  Filled 2016-07-18 (×6): qty 50

## 2016-07-18 MED ORDER — OXYCODONE HCL 5 MG PO TABS: 5.0000 mg | ORAL_TABLET | ORAL | Status: DC | PRN

## 2016-07-18 MED ORDER — ZOLPIDEM TARTRATE 5 MG PO TABS: 5.0000 mg | ORAL_TABLET | Freq: Every evening | ORAL | Status: DC | PRN

## 2016-07-18 MED ORDER — OXYTOCIN BOLUS FROM INFUSION: 500.0000 mL | Freq: Once | INTRAVENOUS | Status: DC

## 2016-07-18 MED ORDER — LIDOCAINE HCL (PF) 1 % IJ SOLN
INTRAMUSCULAR | Status: DC | PRN
  Administered 2016-07-18: 13 mL via EPIDURAL

## 2016-07-18 MED ORDER — DEXTROSE 5 % IV SOLN
5.0000 10*6.[IU] | Freq: Once | INTRAVENOUS | Status: AC
  Administered 2016-07-18: 5 10*6.[IU] via INTRAVENOUS
  Filled 2016-07-18: qty 5

## 2016-07-18 MED ORDER — SOD CITRATE-CITRIC ACID 500-334 MG/5ML PO SOLN: 30.0000 mL | ORAL | Status: DC | PRN

## 2016-07-18 MED ORDER — LACTATED RINGERS IV SOLN
INTRAVENOUS | Status: DC
Start: 2016-07-18 — End: 2016-07-18
  Administered 2016-07-18 (×3): via INTRAVENOUS

## 2016-07-18 MED ORDER — PHENYLEPHRINE 40 MCG/ML (10ML) SYRINGE FOR IV PUSH (FOR BLOOD PRESSURE SUPPORT)
80.0000 ug | PREFILLED_SYRINGE | INTRAVENOUS | Status: DC | PRN
  Administered 2016-07-18: 80 ug via INTRAVENOUS
  Filled 2016-07-18: qty 5

## 2016-07-18 MED ORDER — SIMETHICONE 80 MG PO CHEW: 80.0000 mg | CHEWABLE_TABLET | ORAL | Status: DC | PRN

## 2016-07-18 NOTE — MAU Note (Signed)
Pt here with c/o contractions 5-7 minutes since about 8pm. Denies any bleeding or leaking of fluid. Was 1.5 cm on Friday when last checked.

## 2016-07-18 NOTE — Anesthesia Pain Management Evaluation Note (Signed)
  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 We have an anesthesia team available at all times to provide care throughout the hospital, including epidural management and anesthesia for C-section. I don't know your plan for the delivery whether it a natural birth, water birth, IV sedation, nitrous supplementation, doula or epidural, but we want to meet your pain goals."   1.Was your pain managed to your expectations on prior hospitalizations?   Yes   2.What is your expectation for pain management during this hospitalization?     Epidural  3.How can we help you reach that goal? Epidural when ready.  Record the patient's initial score and the patient's pain goal.   Pain: 5  Pain Goal: 7 [facility] wants you to be able to say your pain was always managed very well.  Janel Beane L 07/18/2016

## 2016-07-18 NOTE — Anesthesia Procedure Notes (Signed)
Epidural Patient location during procedure: OB Start time: 07/18/2016 9:58 AM End time: 07/18/2016 10:14 AM  Staffing Anesthesiologist: Anitra LauthMILLER, Ethylene Reznick RAY Performed: anesthesiologist   Preanesthetic Checklist Completed: patient identified, site marked, surgical consent, pre-op evaluation, timeout performed, IV checked, risks and benefits discussed and monitors and equipment checked  Epidural Patient position: sitting Prep: Betadine Patient monitoring: heart rate, cardiac monitor, continuous pulse ox and blood pressure Approach: midline Location: L2-L3 Injection technique: LOR saline  Needle:  Needle type: Tuohy  Needle gauge: 17 G Needle length: 9 cm Needle insertion depth: 7 cm Catheter type: closed end flexible Catheter size: 20 Guage Catheter at skin depth: 10 cm Test dose: negative  Assessment Events: blood not aspirated, injection not painful, no injection resistance, negative IV test and no paresthesia  Additional Notes Reason for block:procedure for pain

## 2016-07-18 NOTE — Lactation Note (Signed)
This note was copied from a baby's chart. Lactation Consultation Note  Patient Name: Christina Wu Today's Date: 07/18/2016 Reason for consult: [facility] had assisted Mom with initial latch. Mom experienced BF and baby demonstrating some good suckling bursts off/on. Encouraged to continue to offer breast with feeding ques. Positioning of newborn discussed. Lactation brochure left for review, advised of OP services and support group. Encouraged to call for assist as needed, Mom denies questions/concerns.   Maternal Data Has patient been taught Hand Expression?: Yes Does the patient have breastfeeding experience prior to this delivery?: Yes  Feeding Feeding Type: Breast Fed  LATCH Score/Interventions Latch: Grasps breast easily, tongue down, lips flanged, rhythmical sucking.  Audible Swallowing: Spontaneous and intermittent  Type of Nipple: Everted at rest and after stimulation  Comfort (Breast/Nipple): Soft / non-tender     Hold (Positioning): Assistance needed to correctly position infant at breast and maintain latch.  LATCH Score: 9  Lactation Tools Discussed/Used     Consult Status Consult Status: Follow-up Date: 07/19/16 Follow-up type: In-patient    Alfred LevinsGranger, Christina Wu 07/18/2016, 4:36 PM

## 2016-07-18 NOTE — Anesthesia Preprocedure Evaluation (Signed)
Anesthesia Evaluation  Patient identified by MRN, date of birth, ID band Patient awake    Reviewed: Allergy & Precautions, H&P , Patient's Chart, lab work & pertinent test results  Airway Mallampati: III  TM Distance: >3 FB Neck ROM: full    Dental no notable dental hx.    Pulmonary neg pulmonary ROS,    Pulmonary exam normal breath sounds clear to auscultation       Cardiovascular negative cardio ROS   Rhythm:regular Rate:Normal     Neuro/Psych negative neurological ROS  negative psych ROS   GI/Hepatic negative GI ROS, Neg liver ROS,   Endo/Other  negative endocrine ROSMorbid obesity  Renal/GU negative Renal ROS     Musculoskeletal   Abdominal   Peds  Hematology negative hematology ROS (+)   Anesthesia Other Findings   Reproductive/Obstetrics (+) Pregnancy                             Anesthesia Physical  Anesthesia Plan  ASA: III  Anesthesia Plan: Epidural   Post-op Pain Management:    Induction:   Airway Management Planned:   Additional Equipment:   Intra-op Plan:   Post-operative Plan:   Informed Consent: I have reviewed the patients History and Physical, chart, labs and discussed the procedure including the risks, benefits and alternatives for the proposed anesthesia with the patient or authorized representative who has indicated his/her understanding and acceptance.     Plan Discussed with:   Anesthesia Plan Comments:         Anesthesia Quick Evaluation

## 2016-07-18 NOTE — Anesthesia Postprocedure Evaluation (Signed)
Anesthesia Post Note  Patient: Research scientist (physical sciences)Christina Wu  Procedure(s) Performed: * No procedures listed *  Patient location during evaluation: Mother Baby Anesthesia Type: Epidural Level of consciousness: awake and alert and oriented Pain management: satisfactory to patient Vital Signs Assessment: post-procedure vital signs reviewed and stable Respiratory status: spontaneous breathing and nonlabored ventilation Cardiovascular status: stable Postop Assessment: no headache, no backache, no signs of nausea or vomiting, adequate PO intake and patient able to bend at knees (patient up walking) Anesthetic complications: no        Last Vitals:  Vitals:   07/18/16 1701 07/18/16 1830  BP: 130/73 115/66  Pulse: 96 75  Resp:    Temp:      Last Pain:  Vitals:   07/18/16 1257  TempSrc:   PainSc: 0-No pain   Pain Goal:                 [name]

## 2016-07-18 NOTE — Progress Notes (Signed)
S: epidural Some rectal pressure  O: BP 129/84   Pulse 64   Temp 97.9 F (36.6 C) (Oral)   Resp 18   Ht 5\' 1"  (1.549 m)   Wt 97.5 kg (215 lb)   LMP  (Approximate)   SpO2 95%   BMI 40.62 kg/m  Pitocin VE 9.5 /80/-1 Tracing: baseline 135 (+) accel Ctx q 1-4 mins  IMP: active phase GBS cx (+) on IV PCN Term gestation P) cont with present mgmt

## 2016-07-18 NOTE — H&P (Signed)
Christina Wu is a 31 y.o. female presenting @ term in labor. GBS cx (+) intact membrane  OB History    Gravida Para Term Preterm AB Living   2 1 1  0 0 1   SAB TAB Ectopic Multiple Live Births   0 0 0 0 1     Past Medical History:  Diagnosis Date  . Medical history non-contributory    Past Surgical History:  Procedure Laterality Date  . NO PAST SURGERIES     Family History: family history includes Asthma in her father; Diabetes in her mother; Fibroids in her sister; Hypertension in her sister. Social History:  reports that she has never smoked. She has never used smokeless tobacco. She reports that she does not drink alcohol or use drugs.     Maternal Diabetes: No Genetic Screening: Normal Maternal Ultrasounds/Referrals: Normal Fetal Ultrasounds or other Referrals:  None Maternal Substance Abuse:  No Significant Maternal Medications:  None Significant Maternal Lab Results:  Lab values include: Group B Strep positive, Rh negative Other Comments:  None  Review of Systems  All other systems reviewed and are negative.  History Dilation: 4 Effacement (%): 70 Station: -2, -3 Exam by:: Sandy SalaamLynnsey Johnson, RN  Blood pressure 104/68, pulse 62, temperature 98 F (36.7 C), temperature source Oral, resp. rate 18, height 5\' 1"  (1.549 m), weight 97.5 kg (215 lb), SpO2 95 %, unknown if currently breastfeeding. Exam Physical Exam  Constitutional: She is oriented to person, place, and time. She appears well-developed and well-nourished.  Neck: Neck supple.  Cardiovascular: Regular rhythm.   Respiratory: Breath sounds normal.  GI: Soft.  Musculoskeletal: Normal range of motion.  Neurological: She is alert and oriented to person, place, and time.  Skin: Skin is warm and dry.  Psychiatric: She has a normal mood and affect.    Prenatal labs: ABO, Rh: --/--/O NEG (05/30 0110) Antibody: NEG (05/30 0110) Rubella: Immune (10/25 0000) RPR: Nonreactive (10/25 0000)  HBsAg: Negative  (10/25 0000)  HIV: Non-reactive (10/25 0000)  GBS: Positive (11/08 0000)   Assessment/Plan: Labor Rh negative GBS cx (+) Term gestation P) admit routine labs. IV PCN. Pitocin augmentation. Epidural prn  Kirstine Jacquin A 07/18/2016, 6:45 AM

## 2016-07-19 LAB — CBC
HCT: 33.4 % — ABNORMAL LOW (ref 36.0–46.0)
HEMOGLOBIN: 11.6 g/dL — AB (ref 12.0–15.0)
MCH: 30 pg (ref 26.0–34.0)
MCHC: 34.7 g/dL (ref 30.0–36.0)
MCV: 86.3 fL (ref 78.0–100.0)
PLATELETS: 205 10*3/uL (ref 150–400)
RBC: 3.87 MIL/uL (ref 3.87–5.11)
RDW: 14 % (ref 11.5–15.5)
WBC: 17.1 10*3/uL — ABNORMAL HIGH (ref 4.0–10.5)

## 2016-07-19 MED ORDER — RHO D IMMUNE GLOBULIN 1500 UNIT/2ML IJ SOSY
300.0000 ug | PREFILLED_SYRINGE | Freq: Once | INTRAMUSCULAR | Status: AC
  Administered 2016-07-19: 300 ug via INTRAVENOUS
  Filled 2016-07-19: qty 2

## 2016-07-19 NOTE — Progress Notes (Signed)
UR chart review completed.  

## 2016-07-19 NOTE — Progress Notes (Signed)
Postpartum # 1 SVD with 2nd degree repair  S: Mom is feeling well and bonding w/ newborn Anticipating discharge to home Breast feeding w/ formula supplementation Tolerating po, denies n/v Voiding, ambulating OOB Mild cramping +flatus, no BM  O:  Results for [name], Christina (MRN [id number]) as of 07/19/2016 10:59  Ref. Range 07/19/2016 05:14  WBC Latest Ref Range: 4.0 - 10.5 K/uL 17.1 (H)  RBC Latest Ref Range: 3.87 - 5.11 MIL/uL 3.87  Hemoglobin Latest Ref Range: 12.0 - 15.0 g/dL 11.911.6 (L)  HCT Latest Ref Range: 36.0 - 46.0 % 33.4 (L)  MCV Latest Ref Range: 78.0 - 100.0 fL 86.3  MCH Latest Ref Range: 26.0 - 34.0 pg 30.0  MCHC Latest Ref Range: 30.0 - 36.0 g/dL 14.734.7  RDW Latest Ref Range: 11.5 - 15.5 % 14.0  Platelets Latest Ref Range: 150 - 400 K/uL 205  ABO/RH(D) Unknown O NEG  Gestational Age(Wks) Unknown 1339    Newborn's lab results Results for Christina IvanMCPHATTER, GIRL Devine (MRN [id number]) as of 07/19/2016 11:05  Ref. Range 07/18/2016 15:44  Neonatal ABO/RH Unknown A POS  Antibody Identification Unknown POSITIVE DAT PROB...  Blood Bank Results Called Unknown ALERT POSITIVE DA...  DAT, IgG Unknown POS     A&O x 3, vitals stable Lungs CTA, no labored breathing Heart RRR, S1 and S2, no murmurs Abdomen soft, non tender, +bs x 4 quads, FF@u  Perineum ice pack in place Lochia mild rubra, no clots LE no edema, neg Homan's sign, TEDs in place   A/P: PPD#1  SVD w/ 2nd degree perineal lac w/ repair Maternal O neg-rhogham ordered GBS+-tx'd w 4 doses of PCN Lactation consult  Discussed phototherapy of newborn in regard with discharge planning Possible d/c home tomorrow  Jacinto HalimLaTricia Knight, student midwife  Supervised and agree with note Marlinda Mikeanya Shaya Altamura CNM [facility]

## 2016-07-19 NOTE — Lactation Note (Signed)
This note was copied from a baby's chart. Lactation Consultation Note  Patient Name: Christina Wu Today's Date: 07/19/2016 Reason for consult: Follow-up assessment;NICU baby   NICU baby 6326 hours old, in NICU for RH incompatibility. Mom reports that the baby was latching and nursing before going to NICU. Mom has been pumping, has 3-4 ml of EBM at bedside and intends to take EBM to NICU. Enc mom to pump every 2-3 hours for a total of at least 8 times/24 hours. Reviewed EBM storage guidelines, and mom aware of pumping rooms in NICU. Mom reports that she has a Engineer, structuralpersonal Medela at home. Discussed the benefits of a hospital-grade pump, and mom aware of rental. Mom given additional colostrum containers and larger bottles as well.   Maternal Data    Feeding Feeding Type: Formula Nipple Type: Slow - flow Length of feed: 10 min  LATCH Score/Interventions                      Lactation Tools Discussed/Used     Consult Status Consult Status: Follow-up Date: 07/20/16 Follow-up type: In-patient    Sherlyn HayJennifer D Annick Dimaio 07/19/2016, 6:25 PM

## 2016-07-20 LAB — RH IG WORKUP (INCLUDES ABO/RH)
ABO/RH(D): O NEG
Fetal Screen: NEGATIVE
GESTATIONAL AGE(WKS): 39
Unit division: 0

## 2016-07-20 NOTE — Progress Notes (Signed)
Patient screened out for psychosocial assessment since none of the following apply:  Psychosocial stressors documented in mother or baby's chart  Gestation less than 32 weeks  Code at delivery   Infant with anomalies Please contact the Clinical Social Worker if specific needs arise, or by MOB's request.   Asmara Backs Boyd-Gilyard, MSW, LCSW Clinical Social Work [phone]  

## 2016-07-20 NOTE — Progress Notes (Signed)
Postpartum # 2 SVD with 2nd degree repair  S: Mom is feeling well and bonding w/ newborn Anticipating discharge to home Breast feeding w/ formula supplementation Tolerating po, denies n/v Voiding, ambulating OOB Mild cramping +flatus, no BM  O:  Results for [name], Christina (MRN [id number]) as of 07/19/2016 10:59  Ref. Range 07/19/2016 05:14  WBC Latest Ref Range: 4.0 - 10.5 K/uL 17.1 (H)  RBC Latest Ref Range: 3.87 - 5.11 MIL/uL 3.87  Hemoglobin Latest Ref Range: 12.0 - 15.0 g/dL 69.611.6 (L)  HCT Latest Ref Range: 36.0 - 46.0 % 33.4 (L)  MCV Latest Ref Range: 78.0 - 100.0 fL 86.3  MCH Latest Ref Range: 26.0 - 34.0 pg 30.0  MCHC Latest Ref Range: 30.0 - 36.0 g/dL 29.534.7  RDW Latest Ref Range: 11.5 - 15.5 % 14.0  Platelets Latest Ref Range: 150 - 400 K/uL 205  ABO/RH(D) Unknown O NEG  Gestational Age(Wks) Unknown 9539    Newborn's lab results Results for Christina Wu, Christina Wu (MRN [id number]) as of 07/19/2016 11:05  Ref. Range 07/18/2016 15:44  Neonatal ABO/RH Unknown A POS  Antibody Identification Unknown POSITIVE DAT PROB...  Blood Bank Results Called Unknown ALERT POSITIVE DA...  DAT, IgG Unknown POS     A&O x 3, vitals stable Lungs CTA, no labored breathing Heart RRR, S1 and S2, no murmurs Abdomen soft, non tender, +bs x 4 quads, FF@u  Perineum ice pack in place Lochia mild rubra, no clots LE no edema, neg Homan's sign, TEDs in place   A/P: PPD#2  SVD w/ 2nd degree perineal lac w/ repair Maternal O neg-rhogham ordered GBS+-tx'd w 4 doses of PCN Lactation consult  Discussed phototherapy of newborn in regard with discharge planning  d/c home

## 2016-07-23 ENCOUNTER — Ambulatory Visit: Payer: Self-pay

## 2016-07-23 NOTE — Lactation Note (Signed)
This note was copied from a baby's chart. Lactation Consultation Note  Patient Name: Girl Rory PercyJayme Walla Today's Date: 07/23/2016 Reason for consult: Follow-up assessment  NICU baby 435 days old. Mom reports that baby nursed all through the night, and she feels the baby is latching well. Discussed the need to keep supplementing with EBM after baby at breast, and to keep post-pumping as well. Discussed methods of knowing baby getting enough breast milk while nursing. Enc mom to call for LC to assist with latch as needed. Mom aware of OP/BFSG and LC phone line assistance after D/C.   Mom had questions about EBM storage guidelines, so given a handout of the guidelines (copy from pg. 36 of [employer] "Mother and Baby Care," as mom reports that she has booklet at home. Enc mom to call for assistance as needed.   Maternal Data    Feeding    LATCH Score/Interventions                      Lactation Tools Discussed/Used     Consult Status Consult Status: PRN    Sherlyn HayJennifer D Kendahl Bumgardner 07/23/2016, 9:38 AM

## 2016-07-26 NOTE — Discharge Summary (Signed)
Obstetric Discharge Summary Reason for Admission: onset of labor Prenatal Procedures: ultrasound Intrapartum Procedures: spontaneous vaginal delivery, GBS prophylaxis and epidural, Postpartum Procedures: Rho(D) Ig Complications-Operative and Postpartum: 2nd degree perineal laceration Hemoglobin  Date Value Ref Range Status  07/19/2016 11.6 (L) 12.0 - 15.0 g/dL Final   HCT  Date Value Ref Range Status  07/19/2016 33.4 (L) 36.0 - 46.0 % Final    Physical Exam:  General: alert, cooperative and no distress Lochia: appropriate Uterine Fundus: firm Incision: n/a DVT Evaluation: No evidence of DVT seen on physical exam.  Discharge Diagnoses: Term Pregnancy-delivered, Rh negative  Discharge Information: Date: 07/26/2016 Activity: pelvic rest Diet: routine Medications: PNV and Ibuprofen Condition: stable Instructions: refer to practice specific booklet Discharge to: home   Newborn Data: Live born female  Birth Weight: 7 lb 14 oz (3572 g) APGAR: 7, 9  Home with in NICU due to phototherapy.  Christina Wu A 07/26/2016, 5:51 AM

## 2018-04-11 ENCOUNTER — Ambulatory Visit
Admission: RE | Admit: 2018-04-11 | Discharge: 2018-04-11 | Disposition: A | Discharge status: Home / Self Care | Payer: 59 | Source: Ambulatory Visit | Attending: Nurse Practitioner | Admitting: Nurse Practitioner

## 2018-04-11 ENCOUNTER — Ambulatory Visit: Payer: 59 | Admitting: Nurse Practitioner

## 2018-04-11 ENCOUNTER — Encounter: Payer: Self-pay | Admitting: Nurse Practitioner

## 2018-04-11 ENCOUNTER — Other Ambulatory Visit: Payer: Self-pay

## 2018-04-11 ENCOUNTER — Ambulatory Visit: Payer: Self-pay | Admitting: Nurse Practitioner

## 2018-04-11 VITALS — BP 126/76 | HR 73 | Temp 98.1°F | Ht 61.0 in | Wt 196.8 lb

## 2018-04-11 DIAGNOSIS — L819 Disorder of pigmentation, unspecified: Secondary | ICD-10-CM | POA: Diagnosis not present

## 2018-04-11 DIAGNOSIS — R0781 Pleurodynia: Secondary | ICD-10-CM

## 2018-04-11 NOTE — Progress Notes (Signed)
Subjective:     Patient ID: Christina Wu , female    DOB: November 28, 1985 , 33 y.o.   MRN: [id number]   Chief Complaint  Patient presents with  . Back Pain    2 weeks/shooting pain/ right side/ pain under arm is constant    HPI  Right lateral pain   She had been sick 2 weeks ago, now having nagging pain to right lateral chest.  She does hold her daughter who is 46 years old.  Continues to nurse at least once per day.  She works as a Runner, broadcasting/film/video.  No LMP recorded. (Menstrual status: IUD). Has mirena.    Back Pain  This is a new problem. The current episode started in the past 7 days. The pain is present in the lumbar spine. Radiates to: up right side of back. Associated symptoms include chest pain. Pertinent negatives include no abdominal pain. She has tried analgesics (took tylenol once with no relief.  ) for the symptoms.     Past Medical History:  Diagnosis Date  . Medical history non-contributory      Family History  Problem Relation Age of Onset  . Diabetes Mother   . Hypertension Sister   . Fibroids Sister      Current Outpatient Medications:  .  calcium carbonate (TUMS - DOSED IN MG ELEMENTAL CALCIUM) 500 MG chewable tablet, Chew 2 tablets by mouth at bedtime as needed for indigestion or heartburn., Disp: , Rfl:  .  Prenatal Vit-Fe Fumarate-FA (MULTIVITAMIN-PRENATAL) 27-0.8 MG TABS, Take 1 tablet by mouth at bedtime. , Disp: , Rfl:    No Known Allergies   Review of Systems  Constitutional: Negative for fatigue.  Respiratory: Negative for cough and wheezing.   Cardiovascular: Positive for chest pain. Negative for palpitations and leg swelling.  Gastrointestinal: Negative for abdominal pain.  Endocrine: Negative for polydipsia, polyphagia and polyuria.  Musculoskeletal: Positive for back pain.  Skin:       She is concerned about lighter areas to the tip of her nose.  Neurological: Negative for syncope.     Today's Vitals   04/11/18 0913  BP: 126/76  Pulse: 73   Temp: 98.1 F (36.7 C)  TempSrc: Oral  SpO2: 98%  Weight: 196 lb 12.8 oz (89.3 kg)  Height: 5\' 1"  (1.549 m)   Body mass index is 37.19 kg/m.   Objective:  Physical Exam Constitutional:      Appearance: Normal appearance.  Cardiovascular:     Rate and Rhythm: Normal rate and regular rhythm.     Pulses: Normal pulses.     Heart sounds: Normal heart sounds. No murmur.  Pulmonary:     Effort: Pulmonary effort is normal.     Breath sounds: Normal breath sounds.  Skin:    General: Skin is warm and dry.     Capillary Refill: Capillary refill takes less than 2 seconds.     Comments: She does have slightly hypopigmented skin to the tip of her nose.    Neurological:     General: No focal deficit present.     Mental Status: She is alert.         Assessment And Plan:     1. Rib pain on right side  Breath sounds equal bilaterally  No tenderness on palpation  Costochondritis vs rib fracture vs residual inflammation from cold symptoms  Encouraged to take tylenol twice a day for 3-5 days   She is intermittently breast feeding at least once per day  She has tried heating pad on low as well with minimal relief. - DG Chest 2 View; Future - DG Ribs Unilateral Right; Future  2. Hypopigmentation  Tip of nose with hypopigmentation  Offered to refer to Dr. Darreld Mclean however may take up to a year to see her the patient will hold off for now and consider [place] previous dermatologist [place].       Arnette Felts, FNP

## 2018-04-11 NOTE — Patient Instructions (Signed)

## 2018-11-24 ENCOUNTER — Other Ambulatory Visit: Payer: Self-pay

## 2018-11-24 ENCOUNTER — Encounter: Payer: Self-pay | Admitting: Nurse Practitioner

## 2018-11-24 ENCOUNTER — Ambulatory Visit: Payer: 59 | Admitting: Nurse Practitioner

## 2018-11-24 VITALS — Temp 98.0°F | Ht 61.0 in | Wt 203.0 lb

## 2018-11-24 DIAGNOSIS — R42 Dizziness and giddiness: Secondary | ICD-10-CM | POA: Insufficient documentation

## 2018-11-24 DIAGNOSIS — R001 Bradycardia, unspecified: Secondary | ICD-10-CM | POA: Diagnosis not present

## 2018-11-24 MED ORDER — MECLIZINE HCL 12.5 MG PO TABS: 12.5000 mg | ORAL_TABLET | Freq: Three times a day (TID) | ORAL | 0 refills | Status: DC | PRN

## 2018-11-24 NOTE — Patient Instructions (Signed)

## 2018-11-24 NOTE — Progress Notes (Signed)
Subjective:     Patient ID: Christina Wu , female    DOB: [birth year] , 33 y.o.   MRN: [id number]   Chief Complaint  Patient presents with  . Dizziness    patient stated she has been experincing some dizzy spells for the past week and a half. she notices she gets dizzy when she bends down, and in the middle of the night.     HPI  She will drink 1/2 gallon of water per day.  She has an IUD - no menstrual cycle  Dizziness This is a new problem. The current episode started in the past 7 days. The problem occurs intermittently. Pertinent negatives include no abdominal pain, congestion, coughing, fatigue, fever, headaches, nausea or sore throat. Exacerbated by: worse when changing positions and bending down. She has tried lying down and position changes for the symptoms.      Past Medical History:  Diagnosis Date  . Medical history non-contributory      Family History  Problem Relation Age of Onset  . Diabetes Mother   . Hypertension Sister   . Fibroids Sister      Current Outpatient Medications:  .  meclizine (ANTIVERT) 12.5 MG tablet, Take 1 tablet (12.5 mg total) by mouth 3 (three) times daily as needed for dizziness., Disp: 30 tablet, Rfl: 0   No Known Allergies   Review of Systems  Constitutional: Negative for fatigue and fever.  HENT: Negative for congestion and sore throat.   Respiratory: Negative for cough.   Cardiovascular: Negative.   Gastrointestinal: Negative for abdominal pain and nausea.  Neurological: Positive for dizziness. Negative for headaches.  Psychiatric/Behavioral: Negative.      Today's Vitals   11/24/18 0844  Temp: 98 F (36.7 C)  TempSrc: Oral  Weight: 203 lb (92.1 kg)  Height: 5\' 1"  (1.549 m)  PainSc: 2   PainLoc: Head   Body mass index is 38.36 kg/m.   Objective:  Physical Exam Vitals signs reviewed.  Constitutional:      General: She is not in acute distress.    Appearance: Normal appearance.  Cardiovascular:     Rate and  Rhythm: Normal rate and regular rhythm.     Pulses: Normal pulses.     Heart sounds: Normal heart sounds. No murmur.  Pulmonary:     Effort: Pulmonary effort is normal. No respiratory distress.     Breath sounds: Normal breath sounds.  Skin:    Capillary Refill: Capillary refill takes less than 2 seconds.  Neurological:     General: No focal deficit present.     Mental Status: She is alert.  Psychiatric:        Mood and Affect: Mood normal.        Behavior: Behavior normal.        Thought Content: Thought content normal.        Judgment: Judgment normal.         Assessment And Plan:     1. Dizziness  orthostats are negative.  She does have a low normal heart rate will check EKG as a baseline  Will check labs for metabolic, hemoglobin  No abnormal findings on physical exam  Rx for meclizine as needed - CBC no Diff - CMP14 + Anion Gap - Hemoglobin A1c - TSH - VITAMIN D 25 Hydroxy (Vit-D Deficiency, Fractures) - Thyroid antibodies - meclizine (ANTIVERT) 12.5 MG tablet; Take 1 tablet (12.5 mg total) by mouth 3 (three) times daily as needed for dizziness.  Dispense:  30 tablet; Refill: 0  2. Bradycardia  EKG reveals HR 52 with sinus rhythm  Advised if she is worse she needs to call to office or go to ER   Will refer to cardiovascular for further evaluation due to the symptoms - EKG 12-Lead  Minette Brine, FNP    THE PATIENT IS ENCOURAGED TO PRACTICE SOCIAL DISTANCING DUE TO THE COVID-19 PANDEMIC.

## 2018-11-25 LAB — CMP14 + ANION GAP
ALT: 9 IU/L (ref 0–32)
AST: 14 IU/L (ref 0–40)
Albumin/Globulin Ratio: 1.8 (ref 1.2–2.2)
Albumin: 4.6 g/dL (ref 3.8–4.8)
Alkaline Phosphatase: 79 IU/L (ref 39–117)
Anion Gap: 15 mmol/L (ref 10.0–18.0)
BUN/Creatinine Ratio: 20 (ref 9–23)
BUN: 17 mg/dL (ref 6–20)
Bilirubin Total: 0.3 mg/dL (ref 0.0–1.2)
CO2: 22 mmol/L (ref 20–29)
Calcium: 9.4 mg/dL (ref 8.7–10.2)
Chloride: 100 mmol/L (ref 96–106)
Creatinine, Ser: 0.84 mg/dL (ref 0.57–1.00)
GFR calc Af Amer: 105 mL/min/{1.73_m2} (ref >59)
GFR calc non Af Amer: 91 mL/min/{1.73_m2} (ref >59)
Globulin, Total: 2.6 g/dL (ref 1.5–4.5)
Glucose: 78 mg/dL (ref 65–99)
Potassium: 4.6 mmol/L (ref 3.5–5.2)
Sodium: 137 mmol/L (ref 134–144)
Total Protein: 7.2 g/dL (ref 6.0–8.5)

## 2018-11-25 LAB — THYROID ANTIBODIES
Thyroglobulin Antibody: <1 IU/mL (ref 0.0–0.9)
Thyroperoxidase Ab SerPl-aCnc: <9 IU/mL (ref 0–34)

## 2018-11-25 LAB — CBC
Hematocrit: 38.7 % (ref 34.0–46.6)
Hemoglobin: 13 g/dL (ref 11.1–15.9)
MCH: 29.5 pg (ref 26.6–33.0)
MCHC: 33.6 g/dL (ref 31.5–35.7)
MCV: 88 fL (ref 79–97)
Platelets: 334 10*3/uL (ref 150–450)
RBC: 4.41 x10E6/uL (ref 3.77–5.28)
RDW: 11.9 % (ref 11.7–15.4)
WBC: 6.5 10*3/uL (ref 3.4–10.8)

## 2018-11-26 ENCOUNTER — Encounter: Payer: Self-pay | Admitting: Nurse Practitioner

## 2018-12-01 ENCOUNTER — Telehealth: Payer: Self-pay | Admitting: Nurse Practitioner

## 2018-12-01 NOTE — Telephone Encounter (Signed)
Returned call to patient no answer. Advised to return call to the office if she has any questions.

## 2018-12-03 ENCOUNTER — Other Ambulatory Visit: Payer: Self-pay | Admitting: Nurse Practitioner

## 2018-12-03 ENCOUNTER — Encounter: Payer: Self-pay | Admitting: Nurse Practitioner

## 2018-12-04 ENCOUNTER — Other Ambulatory Visit: Payer: Self-pay | Admitting: Nurse Practitioner

## 2018-12-04 DIAGNOSIS — R42 Dizziness and giddiness: Secondary | ICD-10-CM

## 2018-12-04 DIAGNOSIS — R7989 Other specified abnormal findings of blood chemistry: Secondary | ICD-10-CM

## 2018-12-04 LAB — VITAMIN D 25 HYDROXY (VIT D DEFICIENCY, FRACTURES): Vit D, 25-Hydroxy: 30.6 ng/mL (ref 30.0–100.0)

## 2018-12-04 LAB — TSH: TSH: 0.573 u[IU]/mL (ref 0.450–4.500)

## 2018-12-04 MED ORDER — VITAMIN D (ERGOCALCIFEROL) 1.25 MG (50000 UNIT) PO CAPS: 50000.0000 [IU] | ORAL_CAPSULE | ORAL | 1 refills | Status: DC

## 2018-12-11 LAB — SPECIMEN STATUS REPORT

## 2018-12-11 LAB — HGB A1C W/O EAG: Hgb A1c MFr Bld: 5.3 % (ref 4.8–5.6)

## 2018-12-15 NOTE — Progress Notes (Signed)
Cardiology Office Note:   Date:  12/19/2018  NAME:  Christina Wu    MRN: [id number] DOB:  11/12/1985   PCP:  Arnette Felts, FNP  Cardiologist:  No primary care provider on file.   Referring MD: Arnette Felts, FNP   Chief Complaint  Patient presents with  . Bradycardia   History of Present Illness:   Christina Wu is a 33 y.o. female without significant medical history who is being seen today for the evaluation of bradycardia at the request of Arnette Felts, FNP. She was evaluated for dizziness by her PCP a few weeks ago. She reports she became dizzy one night, with the room spinning, and it was worse with position.  Her symptoms started 3 weeks ago.  Since that time, she has had similar symptoms of worsening dizziness with sudden head movement. She has no symptoms when still. She reports no SOB or CP. She exercises regularly. Her symptoms have improves over the past few weeks. She denies headache and hearing loss.  She has had no syncope and has no issues getting up and moving around and doing activities.  She endorses no episodes of exertional chest pain or shortness of breath.  She has no real medical problems and is very healthy. Labs at PCP shows TSH 0.57 and Cr 0.84. Her EKG showed sinus bradycardia ~55 bpm. Today she has a normal EKG with sinus bradycardia and a single PAC today. She has no history of heart disease and is very healthy.   Past Medical History: Past Medical History:  Diagnosis Date  . Medical history non-contributory     Past Surgical History: Past Surgical History:  Procedure Laterality Date  . NO PAST SURGERIES      Current Medications: Current Meds  Medication Sig  . meclizine (ANTIVERT) 12.5 MG tablet Take 1 tablet (12.5 mg total) by mouth 3 (three) times daily as needed for dizziness. (Patient taking differently: Take 12.5 mg by mouth 3 (three) times daily as needed for dizziness. Patient has not picked up yet)  . Vitamin D, Ergocalciferol, (DRISDOL)  1.25 MG (50000 UT) CAPS capsule Take 1 capsule (50,000 Units total) by mouth 2 (two) times a week. (Patient taking differently: Take 50,000 Units by mouth 2 (two) times a week. Patient has not picked up yet)     Allergies:    Patient has no known allergies.   Social History: Social History   Socioeconomic History  . Marital status: Married    Spouse name: Not on file  . Number of children: 2  . Years of education: Not on file  . Highest education level: Not on file  Occupational History  . 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 Financial resource strain: Not on file  . Food insecurity    Worry: Not on file    Inability: Not on file  . Transportation needs    Medical: Not on file    Non-medical: Not on file  Tobacco Use  . Smoking status: Never Smoker  . Smokeless tobacco: Never Used  Substance and Sexual Activity  . Alcohol use: No  . Drug use: No  . Sexual activity: Yes  Lifestyle  . Physical activity    Days per week: Not on file    Minutes per session: Not on file  . Stress: Not on file  Relationships  . Social Musician on phone: Not on file    Gets together: Not on file    Attends religious  service: Not on file    Active member of club or organization: Not on file    Attends meetings of clubs or organizations: Not on file    Relationship status: Not on file  Other Topics Concern  . Not on file  Social History Narrative  . Not on file    Family History: The patient's family history includes Diabetes in her mother; Fibroids in her sister; Hypertension in her father and sister.  ROS:   All other ROS reviewed and negative. Pertinent positives noted in the HPI.     EKGs/Labs/Other Studies Reviewed:   The following studies were personally reviewed by me today:  EKG:  EKG is ordered today.  The ekg ordered today demonstrates normal sinus rhythm, heart rate 60, no acute ST-T changes, sinus arrhythmia noted, a very normal finding, and was  personally reviewed by me.   Recent Labs: 11/24/2018: ALT 9; BUN 17; Creatinine, Ser 0.84; Hemoglobin 13.0; Platelets 334; Potassium 4.6; Sodium 137 12/03/2018: TSH 0.573   Recent Lipid Panel No results found for: CHOL, TRIG, HDL, CHOLHDL, VLDL, LDLCALC, LDLDIRECT  Physical Exam:   VS:  BP 110/72 (BP Location: Right Arm)   Pulse 60   Temp (!) 97.3 F (36.3 C)   Ht 5\' 1"  (1.549 m)   Wt 203 lb 3.2 oz (92.2 kg)   SpO2 98%   BMI 38.39 kg/m    Wt Readings from Last 3 Encounters:  12/19/18 203 lb 3.2 oz (92.2 kg)  11/24/18 203 lb (92.1 kg)  04/11/18 196 lb 12.8 oz (89.3 kg)    General: Well nourished, well developed, in no acute distress Heart: Atraumatic, normal size  Eyes: PEERLA, EOMI  Neck: Supple, no JVD Endocrine: No thryomegaly Cardiac: Normal S1, S2; RRR; no murmurs, rubs, or gallops Lungs: Clear to auscultation bilaterally, no wheezing, rhonchi or rales  Abd: Soft, nontender, no hepatomegaly  Ext: No edema, pulses 2+ Musculoskeletal: No deformities, BUE and BLE strength normal and equal Skin: Warm and dry, no rashes   Neuro: Alert and oriented to person, place, time, and situation, CNII-XII grossly intact, no focal deficits  Psych: Normal mood and affect   ASSESSMENT:   Christina Wu is a 33 y.o. female who presents for the following: 1. Bradycardia, sinus   2. Dizziness   3. PAC (premature atrial contraction)    PLAN:   1. Bradycardia, sinus 2. Dizziness 3. PAC (premature atrial contraction) -EKG demonstrates borderline sinus bradycardia with heart rate around 60 bpm and an occasional PAC.  She has no evidence of conduction disease on this EKG with a narrow QRS.  Her most recent thyroid is normal.  Her symptoms are improving, and overall I suspect she actually has vertigo or an inner ear problem.  I have encouraged her to take her meclizine.  I counseled her that given her normal EKG and overall great state of health, she is not need a pacemaker or an  echocardiogram.  I think we can just continue to watch this for now and should she have further symptoms she can reach out to [place].   Disposition: Return if symptoms worsen or fail to improve.  Medication Adjustments/Labs and Tests Ordered: Current medicines are reviewed at length with the patient today.  Concerns regarding medicines are outlined above.  Orders Placed This Encounter  Procedures  . EKG 12-Lead   No orders of the defined types were placed in this encounter.   Patient Instructions  Medication Instructions:  NO CHANGES  Lab Work:  NOT NEEDED Testing/Procedures: NOT NEEDED  Follow-Up: At [facility], you and your health needs are our priority.  As part of our continuing mission to provide you with exceptional heart care, we have created designated Provider Care Teams.  These Care Teams include your primary Cardiologist (physician) and Advanced Practice Providers (APPs -  Physician Assistants and Nurse Practitioners) who all work together to provide you with the care you need, when you need it.  Your next appointment:   AS NEEDED  The format for your next appointment:   AS NEEDED  Provider:   Lennie OdorWesley O'Neal, MD  Other Instructions     Signed, Lenna GilfordWesley T. Flora Lipps'Neal, MD [facility]  [facility]  [address], [address] [place], [place] [address] [phone]  12/19/2018 4:19 PM

## 2018-12-19 ENCOUNTER — Encounter: Payer: Self-pay | Admitting: Cardiovascular Disease

## 2018-12-19 ENCOUNTER — Ambulatory Visit: Payer: 59 | Admitting: Cardiovascular Disease

## 2018-12-19 ENCOUNTER — Other Ambulatory Visit: Payer: Self-pay

## 2018-12-19 VITALS — BP 110/72 | HR 60 | Temp 97.3°F | Ht 61.0 in | Wt 203.2 lb

## 2018-12-19 DIAGNOSIS — I491 Atrial premature depolarization: Secondary | ICD-10-CM

## 2018-12-19 DIAGNOSIS — R001 Bradycardia, unspecified: Secondary | ICD-10-CM | POA: Diagnosis not present

## 2018-12-19 DIAGNOSIS — R42 Dizziness and giddiness: Secondary | ICD-10-CM

## 2018-12-19 NOTE — Patient Instructions (Signed)
Medication Instructions:  NO CHANGES  Lab Work: NOT NEEDED Testing/Procedures: NOT NEEDED  Follow-Up: At [facility], you and your health needs are our priority.  As part of our continuing mission to provide you with exceptional heart care, we have created designated Provider Care Teams.  These Care Teams include your primary Cardiologist (physician) and Advanced Practice Providers (APPs -  Physician Assistants and Nurse Practitioners) who all work together to provide you with the care you need, when you need it.  Your next appointment:   AS NEEDED  The format for your next appointment:   AS NEEDED  Provider:   Eleonore Chiquito, MD  Other Instructions

## 2018-12-29 ENCOUNTER — Other Ambulatory Visit: Payer: Self-pay | Admitting: Cardiovascular Disease

## 2018-12-29 DIAGNOSIS — R42 Dizziness and giddiness: Secondary | ICD-10-CM

## 2019-01-01 ENCOUNTER — Ambulatory Visit (INDEPENDENT_AMBULATORY_CARE_PROVIDER_SITE_OTHER): Payer: 59 | Admitting: Otolaryngology

## 2019-01-01 ENCOUNTER — Encounter (INDEPENDENT_AMBULATORY_CARE_PROVIDER_SITE_OTHER): Payer: Self-pay | Admitting: Otolaryngology

## 2019-01-01 ENCOUNTER — Other Ambulatory Visit: Payer: Self-pay

## 2019-01-01 VITALS — Temp 97.9°F

## 2019-01-01 DIAGNOSIS — H6123 Impacted cerumen, bilateral: Secondary | ICD-10-CM

## 2019-01-01 DIAGNOSIS — Q892 Congenital malformations of other endocrine glands: Secondary | ICD-10-CM

## 2019-01-01 DIAGNOSIS — R42 Dizziness and giddiness: Secondary | ICD-10-CM | POA: Diagnosis not present

## 2019-01-01 DIAGNOSIS — J3481 Nasal mucositis (ulcerative): Secondary | ICD-10-CM

## 2019-01-01 NOTE — Progress Notes (Signed)
HPI: Christina Wu is a 33 y.o. female who presents for evaluation of dizziness. Patient states she has had episodes of spinning occurring daily for the last four weeks. They occur when she gets up in the morning as well as occasionally when going down stairs. The episodes last seconds and she feels as though they are improving in the last week. She has been evaluated by her PCP and a cardiologist with negative work ups at each. Patient also complains of a dry nose. She denies nose bleeds or nasal congestion. This is irritating for her but she has not tried any medications.  Patient also has cyst near her hyoid she would like evaluated. It was first noticed and evaluated with CT more than 10 years ago but has not been addressed since.  Past Medical History:  Diagnosis Date  . Medical history non-contributory    Past Surgical History:  Procedure Laterality Date  . NO PAST SURGERIES     Social History   Socioeconomic History  . Marital status: Married    Spouse name: Not on file  . Number of children: 2  . Years of education: Not on file  . Highest education level: Not on file  Occupational History  . 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 Financial resource strain: Not on file  . Food insecurity    Worry: Not on file    Inability: Not on file  . Transportation needs    Medical: Not on file    Non-medical: Not on file  Tobacco Use  . Smoking status: Never Smoker  . Smokeless tobacco: Never Used  Substance and Sexual Activity  . Alcohol use: No  . Drug use: No  . Sexual activity: Yes  Lifestyle  . Physical activity    Days per week: Not on file    Minutes per session: Not on file  . Stress: Not on file  Relationships  . Social Musician on phone: Not on file    Gets together: Not on file    Attends religious service: Not on file    Active member of club or organization: Not on file    Attends meetings of clubs or organizations: Not on file    Relationship  status: Not on file  Other Topics Concern  . Not on file  Social History Narrative  . Not on file   Family History  Problem Relation Age of Onset  . Diabetes Mother   . Hypertension Father   . Hypertension Sister   . Fibroids Sister    No Known Allergies Prior to Admission medications   Medication Sig Start Date End Date Taking? Authorizing Provider  meclizine (ANTIVERT) 12.5 MG tablet Take 1 tablet (12.5 mg total) by mouth 3 (three) times daily as needed for dizziness. Patient taking differently: Take 12.5 mg by mouth 3 (three) times daily as needed for dizziness. Patient has not picked up yet 11/24/18  Yes Arnette Felts, FNP  Vitamin D, Ergocalciferol, (DRISDOL) 1.25 MG (50000 UT) CAPS capsule Take 1 capsule (50,000 Units total) by mouth 2 (two) times a week. Patient taking differently: Take 50,000 Units by mouth 2 (two) times a week. Patient has not picked up yet 12/04/18  Yes Arnette Felts, FNP     Positive ROS: positive for dizziness, negative for hearing loss, ear pain  All other systems have been reviewed and were otherwise negative with the exception of those mentioned in the HPI and as above.  Physical  Exam: General: Alert, no acute distress Ears: External ear without lesions or tenderness. Ear canals have mild wax bilaterally which was cleaned in office with curette; intact, clear TMs. Subjectively symmetric hearing bilaterally. Marye Round reveals no evidence of horizontal nystagmus with either direction; no evidence of BPV. Nasal: Mild mucosal edema; septum midline; she has dry skin bilaterally on the septum; the right nostril has a small ulcer on the septum. Oral: Lips, gums, dentition normal. Clear oropharynx Neck: 2 cm soft, mobile lump to the left of midline consistent with thyroglossal duct cyst; otherwise no palpable adenopathy or masses. Lungs: Breathing comfortably; no visible heaves   Cerumen impaction removal  Date/Time: 01/01/2019 4:33 PM Performed by:  Lucilla Edin, PA-C Authorized by: Rozetta Nunnery, MD   Consent:    Consent obtained:  Verbal   Consent given by:  Patient Procedure details:    Location:  L ear and R ear   Procedure type: curette   Post-procedure details:    Inspection:  TM intact and canal normal   Hearing quality:  Improved   Patient tolerance of procedure:  Tolerated well, no immediate complications     Assessment: Dizziness, unspecified; no evidence of BPPV on examination today but with patient history, cannot rule out. Nasal mucositis, ulceration visible Thyroglossal duct cyst, left of midline Cerumen impaction  Plan: Discussed with patient findings on examination today. Although history sounds like BPV, examination today shows no evidence. Would recommend patient trial Epley maneuver, handout provided, for two weeks and if symptoms do not improve, will consider referral to vestibular PT. For patient's nasal ulcer, recommend regular application of Bacitracin to inside of nose using Q-tip. No further intervention required for thyroglossal duct cyst unless patient starts getting infections or problems.  Cerumen removed in office with subjective improvement in symptoms. She will return as needed.  Carla Rashad, PA-C  I have personally seen and examined this patient. I agree with the assessment and plan as outlined above. Radene Journey, MD

## 2019-03-02 ENCOUNTER — Other Ambulatory Visit: Payer: Self-pay

## 2019-03-02 ENCOUNTER — Encounter: Payer: Self-pay | Admitting: Nurse Practitioner

## 2019-03-02 ENCOUNTER — Ambulatory Visit: Payer: 59 | Admitting: Nurse Practitioner

## 2019-03-02 VITALS — BP 116/72 | HR 65 | Temp 98.8°F | Ht 62.2 in | Wt 203.8 lb

## 2019-03-02 DIAGNOSIS — R7989 Other specified abnormal findings of blood chemistry: Secondary | ICD-10-CM | POA: Diagnosis not present

## 2019-03-02 DIAGNOSIS — R42 Dizziness and giddiness: Secondary | ICD-10-CM

## 2019-03-02 DIAGNOSIS — Z0000 Encounter for general adult medical examination without abnormal findings: Secondary | ICD-10-CM | POA: Diagnosis not present

## 2019-03-02 DIAGNOSIS — Z23 Encounter for immunization: Secondary | ICD-10-CM | POA: Diagnosis not present

## 2019-03-02 LAB — POCT URINALYSIS DIPSTICK
Bilirubin, UA: NEGATIVE
Blood, UA: NEGATIVE
Glucose, UA: NEGATIVE
Ketones, UA: NEGATIVE
Leukocytes, UA: NEGATIVE
Nitrite, UA: NEGATIVE
Protein, UA: NEGATIVE
Spec Grav, UA: 1.02 (ref 1.010–1.025)
Urobilinogen, UA: 0.2 E.U./dL
pH, UA: 7.5 (ref 5.0–8.0)

## 2019-03-02 MED ORDER — VITAMIN D (ERGOCALCIFEROL) 1.25 MG (50000 UNIT) PO CAPS: 50000.0000 [IU] | ORAL_CAPSULE | ORAL | 1 refills | Status: DC

## 2019-03-02 MED ORDER — TETANUS-DIPHTH-ACELL PERTUSSIS 5-2.5-18.5 LF-MCG/0.5 IM SUSP: 0.5000 mL | Freq: Once | INTRAMUSCULAR | 0 refills | Status: DC

## 2019-03-02 MED ORDER — TETANUS-DIPHTH-ACELL PERTUSSIS 5-2.5-18.5 LF-MCG/0.5 IM SUSP
0.5000 mL | Freq: Once | INTRAMUSCULAR | Status: AC
  Administered 2019-03-02: 11:00:00 0.5 mL via INTRAMUSCULAR

## 2019-03-02 NOTE — Progress Notes (Signed)
This visit occurred during the SARS-CoV-2 public health emergency.  Safety protocols were in place, including screening questions prior to the visit, additional usage of staff PPE, and extensive cleaning of exam room while observing appropriate contact time as indicated for disinfecting solutions.  Subjective:     Patient ID: Christina Wu , female    DOB: [birth year] , 34 y.o.   MRN: [id number]   Chief Complaint  Patient presents with  . Annual Exam    HPI The patient states she uses IUD for birth control.Negative for Dysmenorrhea and Negative for Menorrhagia.  Dr. Garwin Brothers is her GYN.  Negative for: breast discharge, breast lump(s), breast pain and breast self exam.  Pertinent negatives include abnormal bleeding (hematology), anxiety, decreased libido, depression, difficulty falling sleep, dyspareunia, history of infertility, nocturia, sexual dysfunction, sleep disturbances, urinary incontinence, urinary urgency, vaginal discharge and vaginal itching. Diet regular, limit sugar and carbohydrates. Trying to make better choices. The patient states her exercise level is moderate - daily at least 30 minutes of exercise.     The patient's tobacco use is:  Social History   Tobacco Use  Smoking Status Never Smoker  Smokeless Tobacco Never Used   She has been exposed to passive smoke. The patient's alcohol use is:  Social History   Substance and Sexual Activity  Alcohol Use No   Additional information: currently due with Dr. Garwin Brothers.     Here for HM.    She works as a Pharmacist, hospital, working from home.      Past Medical History:  Diagnosis Date  . Medical history non-contributory      Family History  Problem Relation Age of Onset  . Diabetes Mother   . Hypertension Father   . Hypertension Sister   . Fibroids Sister      Current Outpatient Medications:  Marland Kitchen  Vitamin D, Ergocalciferol, (DRISDOL) 1.25 MG (50000 UT) CAPS capsule, Take 1 capsule (50,000 Units total) by mouth 2 (two)  times a week. (Patient taking differently: Take 50,000 Units by mouth 2 (two) times a week. Patient has not picked up yet), Disp: 24 capsule, Rfl: 1   No Known Allergies   Review of Systems  Constitutional: Negative.   HENT: Negative.   Eyes: Negative.   Respiratory: Negative.   Cardiovascular: Negative.   Gastrointestinal: Negative.   Endocrine: Negative.   Genitourinary: Negative.   Musculoskeletal: Negative.   Skin: Negative.   Allergic/Immunologic: Negative.   Neurological: Negative.   Hematological: Negative.   Psychiatric/Behavioral: Negative.      Today's Vitals   03/02/19 0930  BP: 116/72  Pulse: 65  Temp: 98.8 F (37.1 C)  TempSrc: Oral  Weight: 203 lb 12.8 oz (92.4 kg)  Height: 5' 2.2" (1.58 m)  PainSc: 0-No pain   Body mass index is 37.04 kg/m.   Objective:  Physical Exam Vitals reviewed.  Constitutional:      Appearance: Normal appearance. She is well-developed.  HENT:     Head: Normocephalic and atraumatic.     Right Ear: Hearing, tympanic membrane, ear canal and external ear normal.     Left Ear: Hearing, tympanic membrane, ear canal and external ear normal.     Nose: Nose normal. No congestion.     Mouth/Throat:     Mouth: Mucous membranes are moist.  Eyes:     General: Lids are normal.     Conjunctiva/sclera: Conjunctivae normal.     Pupils: Pupils are equal, round, and reactive to light.     Funduscopic exam:  Right eye: No papilledema.        Left eye: No papilledema.  Neck:     Thyroid: No thyroid mass.     Vascular: No carotid bruit.  Cardiovascular:     Rate and Rhythm: Normal rate and regular rhythm.     Pulses: Normal pulses.     Heart sounds: Normal heart sounds. No murmur.  Pulmonary:     Effort: Pulmonary effort is normal.     Breath sounds: Normal breath sounds.  Chest:     Chest wall: No mass, swelling or tenderness.     Breasts:        Right: Normal. No mass, nipple discharge or tenderness.        Left: Normal. No  mass, nipple discharge or tenderness.  Abdominal:     General: Abdomen is flat. Bowel sounds are normal.     Palpations: Abdomen is soft.  Musculoskeletal:        General: No swelling. Normal range of motion.     Cervical back: Full passive range of motion without pain, normal range of motion and neck supple.     Right lower leg: No edema.     Left lower leg: No edema.  Skin:    General: Skin is warm and dry.     Capillary Refill: Capillary refill takes less than 2 seconds.  Neurological:     General: No focal deficit present.     Mental Status: She is alert and oriented to person, place, and time.     Cranial Nerves: No cranial nerve deficit.     Sensory: No sensory deficit.  Psychiatric:        Mood and Affect: Mood normal.        Behavior: Behavior normal.        Thought Content: Thought content normal.        Judgment: Judgment normal.         Assessment And Plan:    1. Encounter for general adult medical examination w/o abnormal findings Behavior modifications discussed and diet history reviewed.   Pt will continue to exercise regularly and modify diet with low GI, plant based foods and decrease intake of processed foods.  Recommend intake of daily multivitamin, Vitamin D, and calcium.  Recommend for preventive screenings, as well as recommend immunizations that include TDAP - POCT Urinalysis Dipstick (81002) - Tdap (BOOSTRIX) 5-2.5-18.5 LF-MCG/0.5 injection; Inject 0.5 mLs into the muscle once for 1 dose.  Dispense: 0.5 mL; Refill: 0  2. Encounter for immunization  Will give tetanus vaccine today while in office. Refer to order management. TDAP will be administered to adults 74-24 years old every 10 years.  3. Low vitamin D level    Also encouraged to spend 15 minutes in the sun daily.   Currently normal continue with current dose of vitamin d   Arnette Felts, FNP    THE PATIENT IS ENCOURAGED TO PRACTICE SOCIAL DISTANCING DUE TO THE COVID-19 PANDEMIC.

## 2019-03-02 NOTE — Patient Instructions (Addendum)
Health Maintenance  Topic Date Due  . TETANUS/TDAP  02/02/2003  . PAP SMEAR-Modifier  02/01/2005  . INFLUENZA VACCINE  05/20/2019 (Originally 09/20/2018)  . HIV Screening  Completed   Health Maintenance, Female Adopting a healthy lifestyle and getting preventive care are important in promoting health and wellness. Ask your health care provider about:  The right schedule for you to have regular tests and exams.  Things you can do on your own to prevent diseases and keep yourself healthy. What should I know about diet, weight, and exercise? Eat a healthy diet   Eat a diet that includes plenty of vegetables, fruits, low-fat dairy products, and lean protein.  Do not eat a lot of foods that are high in solid fats, added sugars, or sodium. Maintain a healthy weight Body mass index (BMI) is used to identify weight problems. It estimates body fat based on height and weight. Your health care provider can help determine your BMI and help you achieve or maintain a healthy weight. Get regular exercise Get regular exercise. This is one of the most important things you can do for your health. Most adults should:  Exercise for at least 150 minutes each week. The exercise should increase your heart rate and make you sweat (moderate-intensity exercise).  Do strengthening exercises at least twice a week. This is in addition to the moderate-intensity exercise.  Spend less time sitting. Even light physical activity can be beneficial. Watch cholesterol and blood lipids Have your blood tested for lipids and cholesterol at 36 years of age, then have this test every 5 years. Have your cholesterol levels checked more often if:  Your lipid or cholesterol levels are high.  You are older than 36 years of age.  You are at high risk for heart disease. What should I know about cancer screening? Depending on your health history and family history, you may need to have cancer screening at various ages. This may  include screening for:  Breast cancer.  Cervical cancer.  Colorectal cancer.  Skin cancer.  Lung cancer. What should I know about heart disease, diabetes, and high blood pressure? Blood pressure and heart disease  High blood pressure causes heart disease and increases the risk of stroke. This is more likely to develop in people who have high blood pressure readings, are of African descent, or are overweight.  Have your blood pressure checked: ? Every 3-5 years if you are 87-54 years of age. ? Every year if you are 5 years old or older. Diabetes Have regular diabetes screenings. This checks your fasting blood sugar level. Have the screening done:  Once every three years after age 52 if you are at a normal weight and have a low risk for diabetes.  More often and at a younger age if you are overweight or have a high risk for diabetes. What should I know about preventing infection? Hepatitis B If you have a higher risk for hepatitis B, you should be screened for this virus. Talk with your health care provider to find out if you are at risk for hepatitis B infection. Hepatitis C Testing is recommended for:  Everyone born from [birth year] through 1967.  Anyone with known risk factors for hepatitis C. Sexually transmitted infections (STIs)  Get screened for STIs, including gonorrhea and chlamydia, if: ? You are sexually active and are younger than 36 years of age. ? You are older than 36 years of age and your health care provider tells you that you are at  risk for this type of infection. ? Your sexual activity has changed since you were last screened, and you are at increased risk for chlamydia or gonorrhea. Ask your health care provider if you are at risk.  Ask your health care provider about whether you are at high risk for HIV. Your health care provider may recommend a prescription medicine to help prevent HIV infection. If you choose to take medicine to prevent HIV, you should first  get tested for HIV. You should then be tested every 3 months for as long as you are taking the medicine. Pregnancy  If you are about to stop having your period (premenopausal) and you may become pregnant, seek counseling before you get pregnant.  Take 400 to 800 micrograms (mcg) of folic acid every day if you become pregnant.  Ask for birth control (contraception) if you want to prevent pregnancy. Osteoporosis and menopause Osteoporosis is a disease in which the bones lose minerals and strength with aging. This can result in bone fractures. If you are 30 years old or older, or if you are at risk for osteoporosis and fractures, ask your health care provider if you should:  Be screened for bone loss.  Take a calcium or vitamin D supplement to lower your risk of fractures.  Be given hormone replacement therapy (HRT) to treat symptoms of menopause. Follow these instructions at home: Lifestyle  Do not use any products that contain nicotine or tobacco, such as cigarettes, e-cigarettes, and chewing tobacco. If you need help quitting, ask your health care provider.  Do not use street drugs.  Do not share needles.  Ask your health care provider for help if you need support or information about quitting drugs. Alcohol use  Do not drink alcohol if: ? Your health care provider tells you not to drink. ? You are pregnant, may be pregnant, or are planning to become pregnant.  If you drink alcohol: ? Limit how much you use to 0-1 drink a day. ? Limit intake if you are breastfeeding.  Be aware of how much alcohol is in your drink. In the [place] one drink equals one 12 oz bottle of beer (355 mL), one 5 oz glass of wine (148 mL), or one 1 oz glass of hard liquor (44 mL). General instructions  Schedule regular health, dental, and eye exams.  Stay current with your vaccines.  Tell your health care provider if: ? You often feel depressed. ? You have ever been abused or do not feel safe at  home. Summary  Adopting a healthy lifestyle and getting preventive care are important in promoting health and wellness.  Follow your health care provider's instructions about healthy diet, exercising, and getting tested or screened for diseases.  Follow your health care provider's instructions on monitoring your cholesterol and blood pressure. This information is not intended to replace advice given to you by your health care provider. Make sure you discuss any questions you have with your health care provider. Document Revised: 01/29/2018 Document Reviewed: 01/29/2018 Elsevier Patient Education  [place].    Tdap (Tetanus, Diphtheria, Pertussis) Vaccine: What You Need to Know 1. Why get vaccinated? Tdap vaccine can prevent tetanus, diphtheria, and pertussis. Diphtheria and pertussis spread from person to person. Tetanus enters the body through cuts or wounds.  TETANUS (T) causes painful stiffening of the muscles. Tetanus can lead to serious health problems, including being unable to open the mouth, having trouble swallowing and breathing, or death.  DIPHTHERIA (D) can lead  to difficulty breathing, heart failure, paralysis, or death.  PERTUSSIS (aP), also known as "whooping cough," can cause uncontrollable, violent coughing which makes it hard to breathe, eat, or drink. Pertussis can be extremely serious in babies and young children, causing pneumonia, convulsions, brain damage, or death. In teens and adults, it can cause weight loss, loss of bladder control, passing out, and rib fractures from severe coughing. 2. Tdap vaccine Tdap is only for children 7 years and older, adolescents, and adults.  Adolescents should receive a single dose of Tdap, preferably at age 82 or 12 years. Pregnant women should get a dose of Tdap during every pregnancy, to protect the newborn from pertussis. Infants are most at risk for severe, life-threatening complications from pertussis. Adults who have  never received Tdap should get a dose of Tdap. Also, adults should receive a booster dose every 10 years, or earlier in the case of a severe and dirty wound or burn. Booster doses can be either Tdap or Td (a different vaccine that protects against tetanus and diphtheria but not pertussis). Tdap may be given at the same time as other vaccines. 3. Talk with your health care provider Tell your vaccine provider if the person getting the vaccine:  Has had an allergic reaction after a previous dose of any vaccine that protects against tetanus, diphtheria, or pertussis, or has any severe, life-threatening allergies.  Has had a coma, decreased level of consciousness, or prolonged seizures within 7 days after a previous dose of any pertussis vaccine (DTP, DTaP, or Tdap).  Has seizures or another nervous system problem.  Has ever had Guillain-Barr Syndrome (also called GBS).  Has had severe pain or swelling after a previous dose of any vaccine that protects against tetanus or diphtheria. In some cases, your health care provider may decide to postpone Tdap vaccination to a future visit.  People with minor illnesses, such as a cold, may be vaccinated. People who are moderately or severely ill should usually wait until they recover before getting Tdap vaccine.  Your health care provider can give you more information. 4. Risks of a vaccine reaction  Pain, redness, or swelling where the shot was given, mild fever, headache, feeling tired, and nausea, vomiting, diarrhea, or stomachache sometimes happen after Tdap vaccine. People sometimes faint after medical procedures, including vaccination. Tell your provider if you feel dizzy or have vision changes or ringing in the ears.  As with any medicine, there is a very remote chance of a vaccine causing a severe allergic reaction, other serious injury, or death. 5. What if there is a serious problem? An allergic reaction could occur after the vaccinated person  leaves the clinic. If you see signs of a severe allergic reaction (hives, swelling of the face and throat, difficulty breathing, a fast heartbeat, dizziness, or weakness), call 9-1-1 and get the person to the nearest hospital. For other signs that concern you, call your health care provider.  Adverse reactions should be reported to the Vaccine Adverse Event Reporting System (VAERS). Your health care provider will usually file this report, or you can do it yourself. Visit the VAERS website at www.vaers.LAgents.no or call 434-406-3840. VAERS is only for reporting reactions, and VAERS staff do not give medical advice. 6. The National Vaccine Injury Compensation Program The [employer] Vaccine Injury Compensation Program (VICP) is a federal program that was created to compensate people who may have been injured by certain vaccines. Visit the VICP website at SpiritualWord.at or call 608-093-7901 to learn about the  program and about filing a claim. There is a time limit to file a claim for compensation. 7. How can I learn more?  Ask your health care provider.  Call your local or state health department.  Contact the Centers for Disease Control and Prevention (CDC): ? Call 905-707-5701 (1-800-CDC-INFO) or ? Visit CDC's website at PicCapture.uy Vaccine Information Statement Tdap (Tetanus, Diphtheria, Pertussis) Vaccine (05/21/2018) This information is not intended to replace advice given to you by your health care provider. Make sure you discuss any questions you have with your health care provider. Document Revised: 05/30/2018 Document Reviewed: 06/02/2018 Elsevier Patient Education  2020 [employer].

## 2019-08-31 ENCOUNTER — Encounter: Payer: Self-pay | Admitting: Nurse Practitioner

## 2019-08-31 ENCOUNTER — Other Ambulatory Visit: Payer: Self-pay

## 2019-08-31 ENCOUNTER — Ambulatory Visit: Payer: 59 | Admitting: Nurse Practitioner

## 2019-08-31 VITALS — BP 120/82 | HR 85 | Temp 97.2°F | Ht 62.2 in | Wt 205.2 lb

## 2019-08-31 DIAGNOSIS — Z1159 Encounter for screening for other viral diseases: Secondary | ICD-10-CM | POA: Diagnosis not present

## 2019-08-31 DIAGNOSIS — E6609 Other obesity due to excess calories: Secondary | ICD-10-CM | POA: Diagnosis not present

## 2019-08-31 DIAGNOSIS — Z6837 Body mass index (BMI) 37.0-37.9, adult: Secondary | ICD-10-CM

## 2019-08-31 DIAGNOSIS — R7989 Other specified abnormal findings of blood chemistry: Secondary | ICD-10-CM | POA: Diagnosis not present

## 2019-08-31 MED ORDER — PHENTERMINE HCL 15 MG PO CAPS: 15.0000 mg | ORAL_CAPSULE | ORAL | 1 refills | Status: DC

## 2019-08-31 NOTE — Progress Notes (Signed)
This visit occurred during the SARS-CoV-2 public health emergency.  Safety protocols were in place, including screening questions prior to the visit, additional usage of staff PPE, and extensive cleaning of exam room while observing appropriate contact time as indicated for disinfecting solutions.  Subjective:     Patient ID: Christina Wu , female    DOB: 02-11-1986 , 34 y.o.   MRN: [id number]   Chief Complaint  Patient presents with  . vitamin d recheck    HPI  Here for vitamin d recheck, has not been taking regularly for quite some time.  Denies joint pain or fatigue.  She is still struggling with weight loss.  She is cycling and strength training. She will incorporate running with sprinting and jumping rope.  She reports she mostly eats healthy.  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    Wt Readings from Last 3 Encounters: 08/31/19 : 205 lb 3.2 oz (93.1 kg) 03/02/19 : 203 lb 12.8 oz (92.4 kg) 12/19/18 : 203 lb 3.2 oz (92.2 kg)  Denies issues with sleep.   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     Past Medical History:  Diagnosis Date  . Medical history non-contributory      Family History  Problem Relation Age of Onset  . Diabetes Mother   . Hypertension Father   . Hypertension Sister   . Fibroids Sister      Current Outpatient Medications:  Marland Kitchen  Vitamin D, Ergocalciferol, (DRISDOL) 1.25 MG (50000 UNIT) CAPS capsule, Take 1 capsule (50,000 Units total) by mouth 2 (two) times a week. (Patient not taking: Reported on 08/31/2019), Disp: 24 capsule, Rfl: 1   No Known Allergies   Review of Systems  Constitutional: Negative.   Respiratory: Negative.  Negative for wheezing.   Cardiovascular: Negative.  Negative for chest pain, palpitations and leg swelling.  Neurological: Negative for dizziness and headaches.  Psychiatric/Behavioral: Negative.      Today's Vitals   08/31/19 0942  BP: 120/82  Pulse: 85  Temp: (!) 97.2  F (36.2 C)  TempSrc: Oral  Weight: 205 lb 3.2 oz (93.1 kg)  Height: 5' 2.2" (1.58 m)  PainSc: 0-No pain   Body mass index is 37.29 kg/m.   Objective:  Physical Exam Vitals reviewed.  Constitutional:      General: She is not in acute distress.    Appearance: Normal appearance. She is well-developed.  HENT:     Head: Normocephalic and atraumatic.  Eyes:     Pupils: Pupils are equal, round, and reactive to light.  Cardiovascular:     Rate and Rhythm: Normal rate and regular rhythm.     Pulses: Normal pulses.     Heart sounds: Normal heart sounds. No murmur heard.   Pulmonary:     Effort: Pulmonary effort is normal. No respiratory distress.     Breath sounds: Normal breath sounds.  Musculoskeletal:        General: Normal range of motion.  Skin:    General: Skin is warm and dry.     Capillary Refill: Capillary refill takes less than 2 seconds.  Neurological:     General: No focal deficit present.     Mental Status: She is alert and oriented to person, place, and time.     Cranial Nerves: No cranial nerve deficit.  Psychiatric:        Mood and Affect: Mood normal.  Behavior: Behavior normal.        Thought Content: Thought content normal.        Judgment: Judgment normal.         Assessment And Plan:   1. Low vitamin D level  Has not been taking her vitamin d supplement  Will check vitamin d - VITAMIN D 25 Hydroxy (Vit-D Deficiency, Fractures)  2. Encounter for hepatitis C screening test for low risk patient  Will check Hepatitis C screening due to recent recommendations to screen all adults 18 years and older - Hepatitis C antibody  3. Class 2 obesity due to excess calories without serious comorbidity with body mass index (BMI) of 37.0 to 37.9 in adult  Will start on phentermine, discussed side effects of palpitations or difficulty with sleep - call to office  She may need a sleep study done will see how this works  Will also check HgbA1c and  thyroid levels.  She is to follow up in 2 months - phentermine 15 MG capsule; Take 1 capsule (15 mg total) by mouth every morning.  Dispense: 30 capsule; Refill: 1 - TSH - Hemoglobin A1c    Arnette Felts, FNP   THE PATIENT IS ENCOURAGED TO PRACTICE SOCIAL DISTANCING DUE TO THE COVID-19 PANDEMIC.

## 2019-09-01 LAB — TSH: TSH: 0.968 u[IU]/mL (ref 0.450–4.500)

## 2019-09-01 LAB — HEMOGLOBIN A1C
Est. average glucose Bld gHb Est-mCnc: 105 mg/dL
Hgb A1c MFr Bld: 5.3 % (ref 4.8–5.6)

## 2019-09-01 LAB — HEPATITIS C ANTIBODY: Hep C Virus Ab: <0.1 s/co ratio (ref 0.0–0.9)

## 2019-09-01 LAB — VITAMIN D 25 HYDROXY (VIT D DEFICIENCY, FRACTURES): Vit D, 25-Hydroxy: 34.6 ng/mL (ref 30.0–100.0)

## 2019-11-02 ENCOUNTER — Ambulatory Visit: Payer: 59 | Admitting: Nurse Practitioner

## 2019-11-02 ENCOUNTER — Encounter: Payer: Self-pay | Admitting: Nurse Practitioner

## 2019-11-02 ENCOUNTER — Other Ambulatory Visit: Payer: Self-pay

## 2019-11-02 VITALS — BP 118/76 | HR 64 | Temp 98.0°F | Ht 62.2 in | Wt 199.6 lb

## 2019-11-02 DIAGNOSIS — E6609 Other obesity due to excess calories: Secondary | ICD-10-CM | POA: Diagnosis not present

## 2019-11-02 DIAGNOSIS — Z23 Encounter for immunization: Secondary | ICD-10-CM

## 2019-11-02 DIAGNOSIS — Z6837 Body mass index (BMI) 37.0-37.9, adult: Secondary | ICD-10-CM | POA: Diagnosis not present

## 2019-11-02 MED ORDER — PHENTERMINE HCL 15 MG PO CAPS: 15.0000 mg | ORAL_CAPSULE | ORAL | 1 refills | Status: DC

## 2019-11-02 NOTE — Progress Notes (Signed)
Christina Wu as a scribe for Arnette Felts, FNP.,have documented all relevant documentation on the behalf of Arnette Felts, FNP,as directed by  Arnette Felts, FNP while in the presence of Arnette Felts, FNP. This visit occurred during the SARS-CoV-2 public health emergency.  Safety protocols were in place, including screening questions prior to the visit, additional usage of staff PPE, and extensive cleaning of exam room while observing appropriate contact time as indicated for disinfecting solutions.  Subjective:     Patient ID: Christina Wu , female    DOB: 02-Mar-1985 , 34 y.o.   MRN: [id number]   Chief Complaint  Patient presents with  . Weight Check    HPI  Pt here today for weight check  Wt Readings from Last 3 Encounters: 11/02/19 : 199 lb 9.6 oz (90.5 kg) 08/31/19 : 205 lb 3.2 oz (93.1 kg) 03/02/19 : 203 lb 12.8 oz (92.4 kg)  She is tolerating phentermine well.  Continues to exercise with her [place].  She has noticed her appetite was suppressed.  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  She would like to continue through December with her feeling     Past Medical History:  Diagnosis Date  . Medical history non-contributory      Family History  Problem Relation Age of Onset  . Diabetes Mother   . Hypertension Father   . Hypertension Sister   . Fibroids Sister      Current Outpatient Medications:  Marland Kitchen  Multiple Vitamins-Minerals (MULTIVITAMIN WITH MINERALS) tablet, Take 1 tablet by mouth daily., Disp: , Rfl:  .  phentermine 15 MG capsule, Take 1 capsule (15 mg total) by mouth every morning., Disp: 30 capsule, Rfl: 1   No Known Allergies   Review of Systems  Constitutional: Negative.   HENT: Negative.   Respiratory: Negative.   Cardiovascular: Negative.  Negative for chest pain, palpitations and leg swelling.  Genitourinary: Negative.   Skin: Negative.   Psychiatric/Behavioral: Negative.      Today's Vitals   11/02/19  1031  BP: 118/76  Pulse: 64  Temp: 98 F (36.7 C)  TempSrc: Oral  Weight: 199 lb 9.6 oz (90.5 kg)  Height: 5' 2.2" (1.58 m)  PainSc: 0-No pain   Body mass index is 36.27 kg/m.   Objective:  Physical Exam Constitutional:      General: She is not in acute distress.    Appearance: Normal appearance. She is obese.  Cardiovascular:     Rate and Rhythm: Normal rate and regular rhythm.     Pulses: Normal pulses.     Heart sounds: Normal heart sounds. No murmur heard.   Pulmonary:     Effort: Pulmonary effort is normal. No respiratory distress.     Breath sounds: Normal breath sounds.  Neurological:     General: No focal deficit present.     Mental Status: She is alert and oriented to person, place, and time.     Cranial Nerves: No cranial nerve deficit.  Psychiatric:        Mood and Affect: Mood normal.        Behavior: Behavior normal.        Thought Content: Thought content normal.        Judgment: Judgment normal.         Assessment And Plan:     1. Class 2 obesity due to excess calories without serious comorbidity with body mass index (BMI) of 37.0 to 37.9 in adult  She has lost 6 lbs since her last visit  She is tolerating phentermine well.   We will continue on 15 mg at this time  Continue with regular exercise and healthy diet, consider intermittent fasting - phentermine 15 MG capsule; Take 1 capsule (15 mg total) by mouth every morning.  Dispense: 30 capsule; Refill: 1     Patient was given opportunity to ask questions. Patient verbalized understanding of the plan and was able to repeat key elements of the plan. All questions were answered to their satisfaction.   Jeanell Sparrow, FNP, have reviewed all documentation for this visit. The documentation on 11/02/19 for the exam, diagnosis, procedures, and orders are all accurate and complete.   THE PATIENT IS ENCOURAGED TO PRACTICE SOCIAL DISTANCING DUE TO THE COVID-19 PANDEMIC.

## 2019-11-02 NOTE — Patient Instructions (Signed)
Keep up the good work

## 2020-01-11 NOTE — Progress Notes (Signed)
Patient no showed. YL,RMA 

## 2020-01-12 ENCOUNTER — Encounter: Payer: 59 | Admitting: Nurse Practitioner

## 2020-03-03 ENCOUNTER — Ambulatory Visit: Payer: 59 | Admitting: Nurse Practitioner

## 2020-03-03 ENCOUNTER — Encounter: Payer: Self-pay | Admitting: Nurse Practitioner

## 2020-03-03 ENCOUNTER — Other Ambulatory Visit: Payer: Self-pay

## 2020-03-03 VITALS — BP 110/64 | HR 62 | Temp 98.6°F | Ht 62.2 in | Wt 201.4 lb

## 2020-03-03 DIAGNOSIS — Z6836 Body mass index (BMI) 36.0-36.9, adult: Secondary | ICD-10-CM

## 2020-03-03 DIAGNOSIS — Z1231 Encounter for screening mammogram for malignant neoplasm of breast: Secondary | ICD-10-CM

## 2020-03-03 DIAGNOSIS — E6609 Other obesity due to excess calories: Secondary | ICD-10-CM | POA: Diagnosis not present

## 2020-03-03 DIAGNOSIS — R7989 Other specified abnormal findings of blood chemistry: Secondary | ICD-10-CM | POA: Diagnosis not present

## 2020-03-03 DIAGNOSIS — Z6837 Body mass index (BMI) 37.0-37.9, adult: Secondary | ICD-10-CM

## 2020-03-03 DIAGNOSIS — Z0000 Encounter for general adult medical examination without abnormal findings: Secondary | ICD-10-CM

## 2020-03-03 MED ORDER — PHENTERMINE HCL 15 MG PO CAPS: 15.0000 mg | ORAL_CAPSULE | ORAL | 1 refills | Status: DC

## 2020-03-03 NOTE — Progress Notes (Signed)
I,Ludy Messamore,acting as a Education administrator for [employer], FNP.,have documented all relevant documentation on the behalf of Minette Brine, FNP,as directed by  Minette Brine, FNP while in the presence of Minette Brine, [place].  This visit occurred during the SARS-CoV-2 public health emergency.  Safety protocols were in place, including screening questions prior to the visit, additional usage of staff PPE, and extensive cleaning of exam room while observing appropriate contact time as indicated for disinfecting solutions.  Subjective:     Patient ID: Christina Wu , female    DOB: 1985/08/23 , 35 y.o.   MRN: [id number]   Chief Complaint  Patient presents with  . Annual Exam    HPI  Here for HM.  She is followed by Dr Garwin Brothers for her Gyn care. She is still taking her phentermine but admits that she has not been consistent with it. She has no other concerns at this time.    Wt Readings from Last 3 Encounters: 03/03/20 : 201 lb 6.4 oz (91.4 kg) 11/02/19 : 199 lb 9.6 oz (90.5 kg) 08/31/19 : 205 lb 3.2 oz (93.1 kg)  Her weight at home is 197 lbs.  She has been exercising and feels different. She feels like she is losing inches.  She is riding a peleton bike.  She is drinking more teas and notices her bowels and abdomen has been uncomfortable.        Past Medical History:  Diagnosis Date  . Medical history non-contributory      Family History  Problem Relation Age of Onset  . Diabetes Mother   . Hypertension Father   . Hypertension Sister   . Fibroids Sister      Current Outpatient Medications:  Marland Kitchen  Multiple Vitamins-Minerals (MULTIVITAMIN WITH MINERALS) tablet, Take 1 tablet by mouth daily., Disp: , Rfl:  .  phentermine 15 MG capsule, Take 1 capsule (15 mg total) by mouth every morning., Disp: 30 capsule, Rfl: 1   No Known Allergies    The patient states she uses IUD for birth control. Last LMP was No LMP recorded. (Menstrual status: IUD).. Negative for Dysmenorrhea and Negative for  Menorrhagia. Negative for: breast discharge, breast lump(s), breast pain and breast self exam. Associated symptoms include abnormal vaginal bleeding. Pertinent negatives include abnormal bleeding (hematology), anxiety, decreased libido, depression, difficulty falling sleep, dyspareunia, history of infertility, nocturia, sexual dysfunction, sleep disturbances, urinary incontinence, urinary urgency, vaginal discharge and vaginal itching. Diet regular; she will do intermittent fasting unintentional.  The patient states her exercise level is vigorous - [place] daily.    The patient's tobacco use is:  Social History   Tobacco Use  Smoking Status Never Smoker  Smokeless Tobacco Never Used   She has been exposed to passive smoke. The patient's alcohol use is:  Social History   Substance and Sexual Activity  Alcohol Use No     Review of Systems  Constitutional: Negative.   HENT: Negative.   Eyes: Negative.   Respiratory: Negative.   Cardiovascular: Negative.   Gastrointestinal: Negative.   Endocrine: Negative.   Genitourinary: Negative.   Musculoskeletal: Negative.   Skin: Negative.   Allergic/Immunologic: Negative.   Neurological: Negative.   Hematological: Negative.   Psychiatric/Behavioral: Negative.      Today's Vitals   03/03/20 0941  BP: 110/64  Pulse: 62  Temp: 98.6 F (37 C)  TempSrc: Oral  Weight: 201 lb 6.4 oz (91.4 kg)  Height: 5' 2.2" (1.58 m)   Body mass index is 36.6 kg/m.  Objective:  Physical Exam Vitals reviewed.  Constitutional:      General: She is not in acute distress.    Appearance: Normal appearance. She is well-developed. She is obese.  HENT:     Head: Normocephalic and atraumatic.     Right Ear: Hearing, tympanic membrane, ear canal and external ear normal.     Left Ear: Hearing, tympanic membrane, ear canal and external ear normal.     Nose: Nose normal. No congestion.     Mouth/Throat:     Mouth: Mucous membranes are moist.  Eyes:      General: Lids are normal.     Conjunctiva/sclera: Conjunctivae normal.     Pupils: Pupils are equal, round, and reactive to light.     Funduscopic exam:    Right eye: No papilledema.        Left eye: No papilledema.  Neck:     Thyroid: No thyroid mass.     Vascular: No carotid bruit.  Cardiovascular:     Rate and Rhythm: Normal rate and regular rhythm.     Pulses: Normal pulses.     Heart sounds: Normal heart sounds. No murmur heard.   Pulmonary:     Effort: Pulmonary effort is normal.     Breath sounds: Normal breath sounds.  Chest:     Chest wall: No mass, swelling or tenderness.  Breasts:     Right: Normal. No mass, nipple discharge or tenderness.     Left: Normal. No mass, nipple discharge or tenderness.    Abdominal:     General: Abdomen is flat. Bowel sounds are normal.     Palpations: Abdomen is soft.  Musculoskeletal:        General: No swelling. Normal range of motion.     Cervical back: Full passive range of motion without pain, normal range of motion and neck supple.     Right lower leg: No edema.     Left lower leg: No edema.  Skin:    General: Skin is warm and dry.     Capillary Refill: Capillary refill takes less than 2 seconds.  Neurological:     General: No focal deficit present.     Mental Status: She is alert and oriented to person, place, and time.     Cranial Nerves: No cranial nerve deficit.     Sensory: No sensory deficit.  Psychiatric:        Mood and Affect: Mood normal.        Behavior: Behavior normal.        Thought Content: Thought content normal.        Judgment: Judgment normal.         Assessment And Plan:     1. Encounter for general adult medical examination w/o abnormal findings . Behavior modifications discussed and diet history reviewed.   . Pt will continue to exercise regularly and modify diet with low GI, plant based foods and decrease intake of processed foods.  . Recommend intake of daily multivitamin, Vitamin D, and  calcium.  . Recommend mammogram (initial) for preventive screenings, as well as recommend immunizations that include influenza, TDAP (up to date). She will call and schedule a PAP with Dr. Garwin Brothers - CBC - BMP8+eGFR  2. Encounter for screening mammogram for malignant neoplasm of breast  Pt instructed on Self Breast Exam.According to ACOG guidelines Women aged 10 and older are recommended to get an annual mammogram. Referral made to [facility] for appointment scheduling.  Pt encouraged to get annual mammogram - MM Digital Screening; Future  3. Class 2 obesity due to excess calories without serious comorbidity with body mass index (BMI) of 36.0 to 36.9 in adult  Chronic  Discussed healthy diet and regular exercise options   Encouraged to exercise at least 150 minutes per week with 2 days of strength training  Continue with phentermine, tolerating well  Return in 2 months for weight check. - phentermine 15 MG capsule; Take 1 capsule (15 mg total) by mouth every morning.  Dispense: 30 capsule; Refill: 1  4. Low vitamin D level  Will check vitamin D level and supplement as needed.     Also encouraged to spend 15 minutes in the sun daily.  - VITAMIN D 25 Hydroxy (Vit-D Deficiency, Fractures)     Patient was given opportunity to ask questions. Patient verbalized understanding of the plan and was able to repeat key elements of the plan. All questions were answered to their satisfaction.   Minette Brine, FNP   I, Minette Brine, FNP, have reviewed all documentation for this visit. The documentation on 03/07/20 for the exam, diagnosis, procedures, and orders are all accurate and complete.   THE PATIENT IS ENCOURAGED TO PRACTICE SOCIAL DISTANCING DUE TO THE COVID-19 PANDEMIC.

## 2020-03-03 NOTE — Patient Instructions (Signed)
Health Maintenance, Female Adopting a healthy lifestyle and getting preventive care are important in promoting health and wellness. Ask your health care provider about:  The right schedule for you to have regular tests and exams.  Things you can do on your own to prevent diseases and keep yourself healthy. What should I know about diet, weight, and exercise? Eat a healthy diet  Eat a diet that includes plenty of vegetables, fruits, low-fat dairy products, and lean protein.  Do not eat a lot of foods that are high in solid fats, added sugars, or sodium.   Maintain a healthy weight Body mass index (BMI) is used to identify weight problems. It estimates body fat based on height and weight. Your health care provider can help determine your BMI and help you achieve or maintain a healthy weight. Get regular exercise Get regular exercise. This is one of the most important things you can do for your health. Most adults should:  Exercise for at least 150 minutes each week. The exercise should increase your heart rate and make you sweat (moderate-intensity exercise).  Do strengthening exercises at least twice a week. This is in addition to the moderate-intensity exercise.  Spend less time sitting. Even light physical activity can be beneficial. Watch cholesterol and blood lipids Have your blood tested for lipids and cholesterol at 37 years of age, then have this test every 5 years. Have your cholesterol levels checked more often if:  Your lipid or cholesterol levels are high.  You are older than 37 years of age.  You are at high risk for heart disease. What should I know about cancer screening? Depending on your health history and family history, you may need to have cancer screening at various ages. This may include screening for:  Breast cancer.  Cervical cancer.  Colorectal cancer.  Skin cancer.  Lung cancer. What should I know about heart disease, diabetes, and high blood  pressure? Blood pressure and heart disease  High blood pressure causes heart disease and increases the risk of stroke. This is more likely to develop in people who have high blood pressure readings, are of African descent, or are overweight.  Have your blood pressure checked: ? Every 3-5 years if you are 18-39 years of age. ? Every year if you are 40 years old or older. Diabetes Have regular diabetes screenings. This checks your fasting blood sugar level. Have the screening done:  Once every three years after age 40 if you are at a normal weight and have a low risk for diabetes.  More often and at a younger age if you are overweight or have a high risk for diabetes. What should I know about preventing infection? Hepatitis B If you have a higher risk for hepatitis B, you should be screened for this virus. Talk with your health care provider to find out if you are at risk for hepatitis B infection. Hepatitis C Testing is recommended for:  Everyone born from 1945 through 1965.  Anyone with known risk factors for hepatitis C. Sexually transmitted infections (STIs)  Get screened for STIs, including gonorrhea and chlamydia, if: ? You are sexually active and are younger than 37 years of age. ? You are older than 37 years of age and your health care provider tells you that you are at risk for this type of infection. ? Your sexual activity has changed since you were last screened, and you are at increased risk for chlamydia or gonorrhea. Ask your health care provider   if you are at risk.  Ask your health care provider about whether you are at high risk for HIV. Your health care provider may recommend a prescription medicine to help prevent HIV infection. If you choose to take medicine to prevent HIV, you should first get tested for HIV. You should then be tested every 3 months for as long as you are taking the medicine. Pregnancy  If you are about to stop having your period (premenopausal) and  you may become pregnant, seek counseling before you get pregnant.  Take 400 to 800 micrograms (mcg) of folic acid every day if you become pregnant.  Ask for birth control (contraception) if you want to prevent pregnancy. Osteoporosis and menopause Osteoporosis is a disease in which the bones lose minerals and strength with aging. This can result in bone fractures. If you are 65 years old or older, or if you are at risk for osteoporosis and fractures, ask your health care provider if you should:  Be screened for bone loss.  Take a calcium or vitamin D supplement to lower your risk of fractures.  Be given hormone replacement therapy (HRT) to treat symptoms of menopause. Follow these instructions at home: Lifestyle  Do not use any products that contain nicotine or tobacco, such as cigarettes, e-cigarettes, and chewing tobacco. If you need help quitting, ask your health care provider.  Do not use street drugs.  Do not share needles.  Ask your health care provider for help if you need support or information about quitting drugs. Alcohol use  Do not drink alcohol if: ? Your health care provider tells you not to drink. ? You are pregnant, may be pregnant, or are planning to become pregnant.  If you drink alcohol: ? Limit how much you use to 0-1 drink a day. ? Limit intake if you are breastfeeding.  Be aware of how much alcohol is in your drink. In the [place] one drink equals one 12 oz bottle of beer (355 mL), one 5 oz glass of wine (148 mL), or one 1 oz glass of hard liquor (44 mL). General instructions  Schedule regular health, dental, and eye exams.  Stay current with your vaccines.  Tell your health care provider if: ? You often feel depressed. ? You have ever been abused or do not feel safe at home. Summary  Adopting a healthy lifestyle and getting preventive care are important in promoting health and wellness.  Follow your health care provider's instructions about healthy  diet, exercising, and getting tested or screened for diseases.  Follow your health care provider's instructions on monitoring your cholesterol and blood pressure. This information is not intended to replace advice given to you by your health care provider. Make sure you discuss any questions you have with your health care provider. Document Revised: 01/29/2018 Document Reviewed: 01/29/2018 Elsevier Patient Education  2021 [employer]  

## 2020-03-04 LAB — CBC
Hematocrit: 37 % (ref 34.0–46.6)
Hemoglobin: 12.5 g/dL (ref 11.1–15.9)
MCH: 29.9 pg (ref 26.6–33.0)
MCHC: 33.8 g/dL (ref 31.5–35.7)
MCV: 89 fL (ref 79–97)
Platelets: 381 10*3/uL (ref 150–450)
RBC: 4.18 x10E6/uL (ref 3.77–5.28)
RDW: 12 % (ref 11.7–15.4)
WBC: 7.7 10*3/uL (ref 3.4–10.8)

## 2020-03-04 LAB — BMP8+EGFR
BUN/Creatinine Ratio: 9 (ref 9–23)
BUN: 8 mg/dL (ref 6–20)
CO2: 23 mmol/L (ref 20–29)
Calcium: 9.2 mg/dL (ref 8.7–10.2)
Chloride: 102 mmol/L (ref 96–106)
Creatinine, Ser: 0.88 mg/dL (ref 0.57–1.00)
GFR calc Af Amer: 98 mL/min/{1.73_m2} (ref >59)
GFR calc non Af Amer: 85 mL/min/{1.73_m2} (ref >59)
Glucose: 81 mg/dL (ref 65–99)
Potassium: 4.3 mmol/L (ref 3.5–5.2)
Sodium: 139 mmol/L (ref 134–144)

## 2020-03-04 LAB — VITAMIN D 25 HYDROXY (VIT D DEFICIENCY, FRACTURES): Vit D, 25-Hydroxy: 21.7 ng/mL — ABNORMAL LOW (ref 30.0–100.0)

## 2020-05-02 ENCOUNTER — Ambulatory Visit: Payer: 59 | Admitting: Nurse Practitioner

## 2020-10-29 ENCOUNTER — Emergency Department: Payer: 59

## 2020-10-29 ENCOUNTER — Encounter: Payer: Self-pay | Admitting: *Deleted

## 2020-10-29 ENCOUNTER — Emergency Department
Admission: EM | Admit: 2020-10-29 | Discharge: 2020-10-30 | Disposition: A | Discharge status: Home / Self Care | Payer: 59 | Attending: Emergency Medicine | Admitting: Emergency Medicine

## 2020-10-29 ENCOUNTER — Other Ambulatory Visit: Payer: Self-pay

## 2020-10-29 DIAGNOSIS — R0789 Other chest pain: Secondary | ICD-10-CM | POA: Diagnosis not present

## 2020-10-29 DIAGNOSIS — R079 Chest pain, unspecified: Secondary | ICD-10-CM | POA: Diagnosis present

## 2020-10-29 DIAGNOSIS — Y92410 Unspecified street and highway as the place of occurrence of the external cause: Secondary | ICD-10-CM | POA: Insufficient documentation

## 2020-10-29 DIAGNOSIS — R102 Pelvic and perineal pain: Secondary | ICD-10-CM | POA: Diagnosis not present

## 2020-10-29 LAB — BASIC METABOLIC PANEL
Anion gap: 8 (ref 5–15)
BUN: 9 mg/dL (ref 6–20)
CO2: 27 mmol/L (ref 22–32)
Calcium: 9 mg/dL (ref 8.9–10.3)
Chloride: 103 mmol/L (ref 98–111)
Creatinine, Ser: 0.76 mg/dL (ref 0.44–1.00)
GFR, Estimated: >60 mL/min (ref >60)
Glucose, Bld: 88 mg/dL (ref 70–99)
Potassium: 3.8 mmol/L (ref 3.5–5.1)
Sodium: 138 mmol/L (ref 135–145)

## 2020-10-29 LAB — CBC
HCT: 35.5 % — ABNORMAL LOW (ref 36.0–46.0)
Hemoglobin: 12.4 g/dL (ref 12.0–15.0)
MCH: 31.2 pg (ref 26.0–34.0)
MCHC: 34.9 g/dL (ref 30.0–36.0)
MCV: 89.2 fL (ref 80.0–100.0)
Platelets: 302 10*3/uL (ref 150–400)
RBC: 3.98 MIL/uL (ref 3.87–5.11)
RDW: 12.4 % (ref 11.5–15.5)
WBC: 9.6 10*3/uL (ref 4.0–10.5)
nRBC: 0 % (ref 0.0–0.2)

## 2020-10-29 LAB — TROPONIN I (HIGH SENSITIVITY)
Troponin I (High Sensitivity): 3 ng/L (ref <18)
Troponin I (High Sensitivity): 2 ng/L (ref <18)

## 2020-10-29 LAB — HCG, QUANTITATIVE, PREGNANCY: hCG, Beta Chain, Quant, S: <1 m[IU]/mL (ref <5)

## 2020-10-29 LAB — POC URINE PREG, ED: Preg Test, Ur: NEGATIVE

## 2020-10-29 MED ORDER — IOHEXOL 350 MG/ML SOLN
100.0000 mL | Freq: Once | INTRAVENOUS | Status: AC | PRN
  Administered 2020-10-29: 100 mL via INTRAVENOUS

## 2020-10-29 NOTE — Discharge Instructions (Signed)
All the test we have done today are negative or within normal limits including a chest CT.  Likely Tylenol or Motrin will make this pain better and you we will have her go away within several days.  Please return if she gets short of breath or have worse pain.

## 2020-10-29 NOTE — ED Notes (Signed)
Given warm blankets, call light in reach - aware of need for urine sample.
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
## 2020-10-29 NOTE — ED Triage Notes (Signed)
First RN Note: pt to ED via POV with c/o generalized CP that started earlier today. Pt states pain to the "chest cavity", states when she "inhales it's constricting". Pt A&O x4, ambulatory to the desk with NAD noted, able to speak in full and complete sentences upon arrival to triage desk.
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
## 2020-10-31 ENCOUNTER — Telehealth: Payer: Self-pay
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
## 2020-11-03 ENCOUNTER — Telehealth: Payer: Self-pay

## 2020-11-17 NOTE — Telephone Encounter (Signed)
Error

## 2021-03-07 ENCOUNTER — Encounter: Payer: 59 | Admitting: Nurse Practitioner

## 2021-03-07 NOTE — Progress Notes (Deleted)
°  I,Morton Simson T Derico Mitton,acting as a Neurosurgeon for Arnette Felts, FNP.,have documented all relevant documentation on the behalf of Arnette Felts, FNP,as directed by  Arnette Felts, FNP while in the presence of Arnette Felts, FNP.  This visit occurred during the SARS-CoV-2 public health emergency.  Safety protocols were in place, including screening questions prior to the visit, additional usage of staff PPE, and extensive cleaning of exam room while observing appropriate contact time as indicated for disinfecting solutions.  Subjective:     Patient ID: Christina Wu , female    DOB: [birth year] , 36 y.o.   MRN: [id number]   Chief Complaint  Patient presents with   Annual Exam    HPI  HPI   Past Medical History:  Diagnosis Date   Medical history non-contributory      Family History  Problem Relation Age of Onset   Diabetes Mother    Hypertension Father    Hypertension Sister    Fibroids Sister      Current Outpatient Medications:    meloxicam (MOBIC) 15 MG tablet, Take 15 mg by mouth daily., Disp: , Rfl:    Multiple Vitamins-Minerals (MULTIVITAMIN WITH MINERALS) tablet, Take 1 tablet by mouth daily., Disp: , Rfl:    phentermine 15 MG capsule, Take 1 capsule (15 mg total) by mouth every morning., Disp: 30 capsule, Rfl: 1   No Known Allergies    The patient states she uses {contraceptive methods:5051} for birth control. Last LMP was No LMP recorded. (Menstrual status: IUD).. {Dysmenorrhea-menorrhagia:21918}. Negative for: breast discharge, breast lump(s), breast pain and breast self exam. Associated symptoms include abnormal vaginal bleeding. Pertinent negatives include abnormal bleeding (hematology), anxiety, decreased libido, depression, difficulty falling sleep, dyspareunia, history of infertility, nocturia, sexual dysfunction, sleep disturbances, urinary incontinence, urinary urgency, vaginal discharge and vaginal itching. Diet regular.The patient states her exercise level is    .  The patient's tobacco use is:  Social History   Tobacco Use  Smoking Status Never  Smokeless Tobacco Never  . She has been exposed to passive smoke. The patient's alcohol use is:  Social History   Substance and Sexual Activity  Alcohol Use No  . Additional information: Last pap ***, next one scheduled for ***.    Review of Systems   There were no vitals filed for this visit. There is no height or weight on file to calculate BMI.   Objective:  Physical Exam      Assessment And Plan:     1. Encounter for general adult medical examination w/o abnormal findings - POCT Urinalysis Dipstick (81002) - POCT UA - Microalbumin  2. Class 2 obesity due to excess calories without serious comorbidity with body mass index (BMI) of 36.0 to 36.9 in adult - POCT Urinalysis Dipstick (81002) - POCT UA - Microalbumin     Patient was given opportunity to ask questions. Patient verbalized understanding of the plan and was able to repeat key elements of the plan. All questions were answered to their satisfaction.   Coolidge Breeze, CMA   I, Coolidge Breeze, CMA, have reviewed all documentation for this visit. The documentation on 03/07/21 for the exam, diagnosis, procedures, and orders are all accurate and complete.  THE PATIENT IS ENCOURAGED TO PRACTICE SOCIAL DISTANCING DUE TO THE COVID-19 PANDEMIC.

## 2021-03-07 NOTE — Patient Instructions (Signed)

## 2021-04-18 NOTE — Progress Notes (Signed)
No show

## 2021-08-17 ENCOUNTER — Telehealth (INDEPENDENT_AMBULATORY_CARE_PROVIDER_SITE_OTHER): Payer: Self-pay

## 2021-08-17 NOTE — Telephone Encounter (Signed)
Spoke with pt regarding her referral. I advised that I do not have a referral I office for her. Pt stated that she will call referring office and give them both fax numbers that I provided to her. Will await referral.

## 2021-08-24 ENCOUNTER — Emergency Department
Admission: EM | Admit: 2021-08-24 | Discharge: 2021-08-24 | Disposition: A | Discharge status: Home / Self Care | Payer: 59 | Attending: Emergency Medicine | Admitting: Emergency Medicine

## 2021-08-24 ENCOUNTER — Emergency Department: Payer: 59

## 2021-08-24 ENCOUNTER — Other Ambulatory Visit: Payer: Self-pay

## 2021-08-24 ENCOUNTER — Encounter: Payer: Self-pay | Admitting: Emergency Medicine

## 2021-08-24 DIAGNOSIS — M79605 Pain in left leg: Secondary | ICD-10-CM

## 2021-08-24 DIAGNOSIS — I8392 Asymptomatic varicose veins of left lower extremity: Secondary | ICD-10-CM

## 2021-08-24 NOTE — ED Provider Notes (Signed)
[facility] Provider Note    Event Date/Time   First MD Initiated Contact with Patient 08/24/21 [phone]     (approximate)   History   Leg Pain   HPI  Christina Wu is a 36 y.o. female who presents to the ED for evaluation of Leg Pain   I reviewed PCP visit from 2022.  Obese patient without much medical history.  She presents to the ED today requesting referral to vascular surgery for subacute left leg pain.  She reports distal left leg pain beneath her knee, posteriorly, for the past few weeks without any incident, falls or trauma.  Reports concern that she has visible veins to this area and that these may be contributing.  She reports concern for DVT and requests an ultrasound.  No systemic symptoms, chest pain, syncope, shortness of breath, falls or injuries, fever or rash.  Physical Exam   Triage Vital Signs: ED Triage Vitals  Enc Vitals Group     BP 08/24/21 0018 118/85     Pulse Rate 08/24/21 0018 65     Resp 08/24/21 0018 20     Temp 08/24/21 0018 98.3 F (36.8 C)     Temp Source 08/24/21 0018 Oral     SpO2 08/24/21 0018 100 %     Weight 08/24/21 0021 190 lb (86.2 kg)     Height 08/24/21 0021 5\' 1"  (1.549 m)     Head Circumference --      Peak Flow --      Pain Score 08/24/21 0020 6     Pain Loc --      Pain Edu? --      Excl. in GC? --     Most recent vital signs: Vitals:   08/24/21 0018  BP: 118/85  Pulse: 65  Resp: 20  Temp: 98.3 F (36.8 C)  SpO2: 100%    General: Awake, no distress.  CV:  Good peripheral perfusion.  Resp:  Normal effort.  Abd:  No distention.  MSK:  No deformity noted.  No appreciable asymmetry to her legs.  I do not appreciate any significant edema or swelling.  No erythema, skin changes.  Full active and passive range of motion without any pain.  No palpable Baker's cyst. Neuro:  No focal deficits appreciated. Cranial nerves II through XII intact 5/5 strength and sensation in all 4  extremities Other:     ED Results / Procedures / Treatments   Labs (all labs ordered are listed, but only abnormal results are displayed) Labs Reviewed - No data to display  EKG   RADIOLOGY Plain film the left tib-fib interpreted by me without evidence of fracture or mass Venous ultrasound interpreted by me without evidence of DVT  Official radiology report(s): DG Tibia/Fibula Left  Result Date: 08/24/2021 CLINICAL DATA:  Posterior atraumatic pain for several weeks EXAM: LEFT TIBIA AND FIBULA - 2 VIEW COMPARISON:  None Available. FINDINGS: There is no evidence of fracture or other focal bone lesions. Soft tissues are unremarkable. IMPRESSION: Negative. Electronically Signed   By: 10/25/2021 M.D.   On: 08/24/2021 04:37   10/25/2021 Venous Img Lower Unilateral Left  Result Date: 08/24/2021 CLINICAL DATA:  With left leg pain for several weeks EXAM: LEFT LOWER EXTREMITY VENOUS DOPPLER ULTRASOUND TECHNIQUE: Gray-scale sonography with compression, as well as color and duplex ultrasound, were performed to evaluate the deep venous system(s) from the level of the common femoral vein through the popliteal and proximal calf veins. COMPARISON:  None Available. FINDINGS: VENOUS Normal compressibility of the common femoral, superficial femoral, and popliteal veins, as well as the visualized calf veins. Visualized portions of profunda femoral vein and great saphenous vein unremarkable. No filling defects to suggest DVT on grayscale or color Doppler imaging. Doppler waveforms show normal direction of venous flow, normal respiratory plasticity and response to augmentation. Limited views of the contralateral common femoral vein are unremarkable. IMPRESSION: Negative for DVT in the left lower extremity Electronically Signed   By: Tiburcio Pea M.D.   On: 08/24/2021 04:36    PROCEDURES and INTERVENTIONS:  Procedures  Medications - No data to display   IMPRESSION / MDM / ASSESSMENT AND PLAN / ED COURSE  I  reviewed the triage vital signs and the nursing notes.  Differential diagnosis includes, but is not limited to, cancer, DVT, SVT, Baker's cyst, cellulitis  36 year old female presents with subacute left leg pain without evidence of acute pathology and suitable for outpatient management.  She look systemically well and has normal vital signs.  Imaging is reassuring, as above.  Lateral appreciate any pathology on examination.  No barriers to outpatient management.      FINAL CLINICAL IMPRESSION(S) / ED DIAGNOSES   Final diagnoses:  None     Rx / DC Orders   ED Discharge Orders     None        Note:  This document was prepared using Dragon voice recognition software and may include unintentional dictation errors.   Delton [place], MD 08/24/21 [phone]

## 2021-08-24 NOTE — ED Triage Notes (Signed)
Patient ambulatory to triage with steady gait, without difficulty or distress noted; pt reports left lower leg pain for several wks; denies any injuries, no swelling; pt reports she is concerned over a blood clot

## 2021-08-29 ENCOUNTER — Telehealth: Payer: Self-pay

## 2021-08-29 NOTE — Telephone Encounter (Signed)
Transition Care Management Unsuccessful Follow-up Telephone Call  Date of discharge and from where:  08/24/2021 [place]    Attempts:  1st Attempt  Reason for unsuccessful TCM follow-up call:  Left voice message

## 2021-09-05 ENCOUNTER — Telehealth: Payer: Self-pay

## 2021-09-05 NOTE — Telephone Encounter (Signed)
Transition Care Management Unsuccessful Follow-up Telephone Call  Date of discharge and from where:  08/24/2021   Attempts:  3rd Attempt  Reason for unsuccessful TCM follow-up call:  Left voice message

## 2021-12-18 ENCOUNTER — Encounter (INDEPENDENT_AMBULATORY_CARE_PROVIDER_SITE_OTHER): Payer: Self-pay
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
## 2022-12-03 IMAGING — CR DG CHEST 2V
2 series · 2 of 2 positions shown · non-contrast
Comparison: 04/11/2018

CLINICAL DATA: Generalized chest pain for 1 day, pain with
inspiration

EXAM:
CHEST - 2 VIEW

[chest pa]
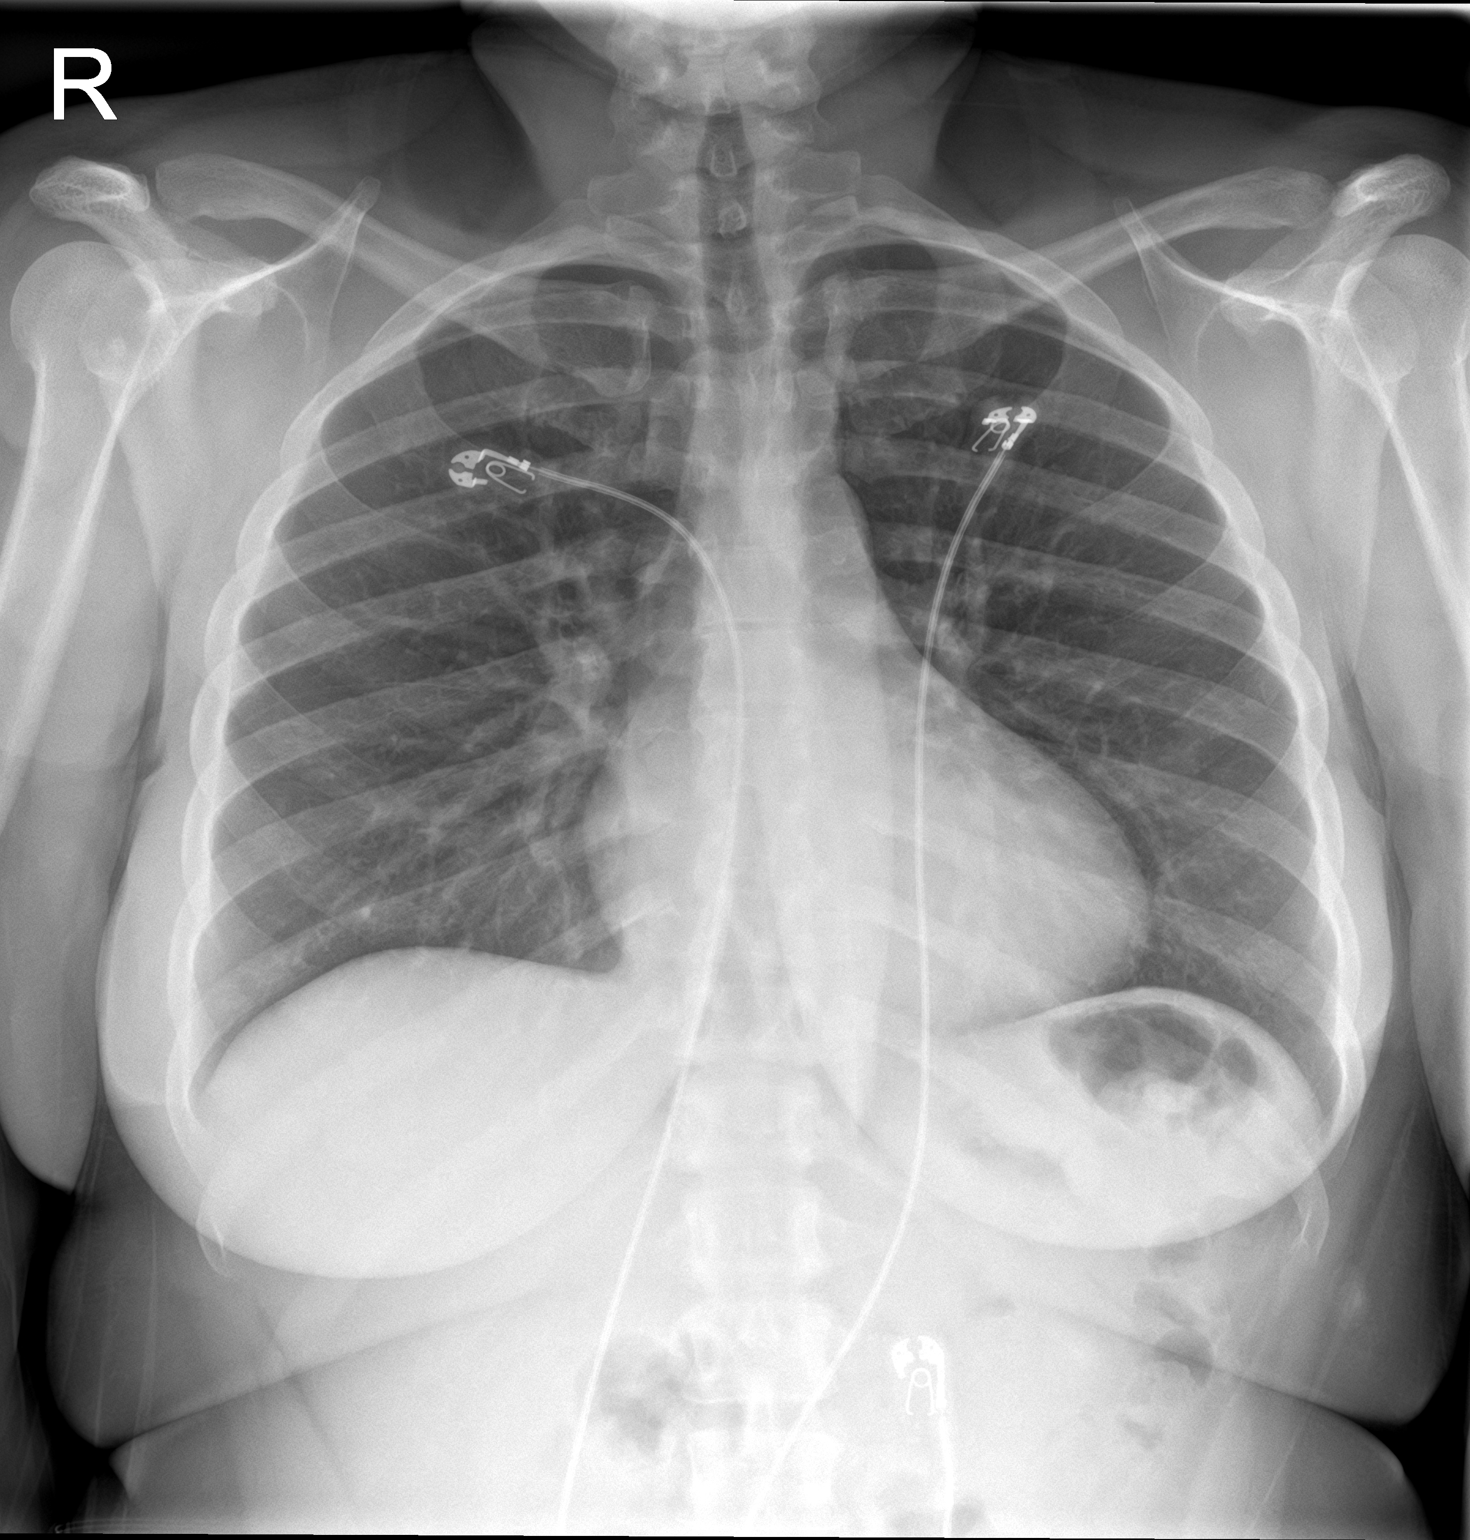

[chest lat]
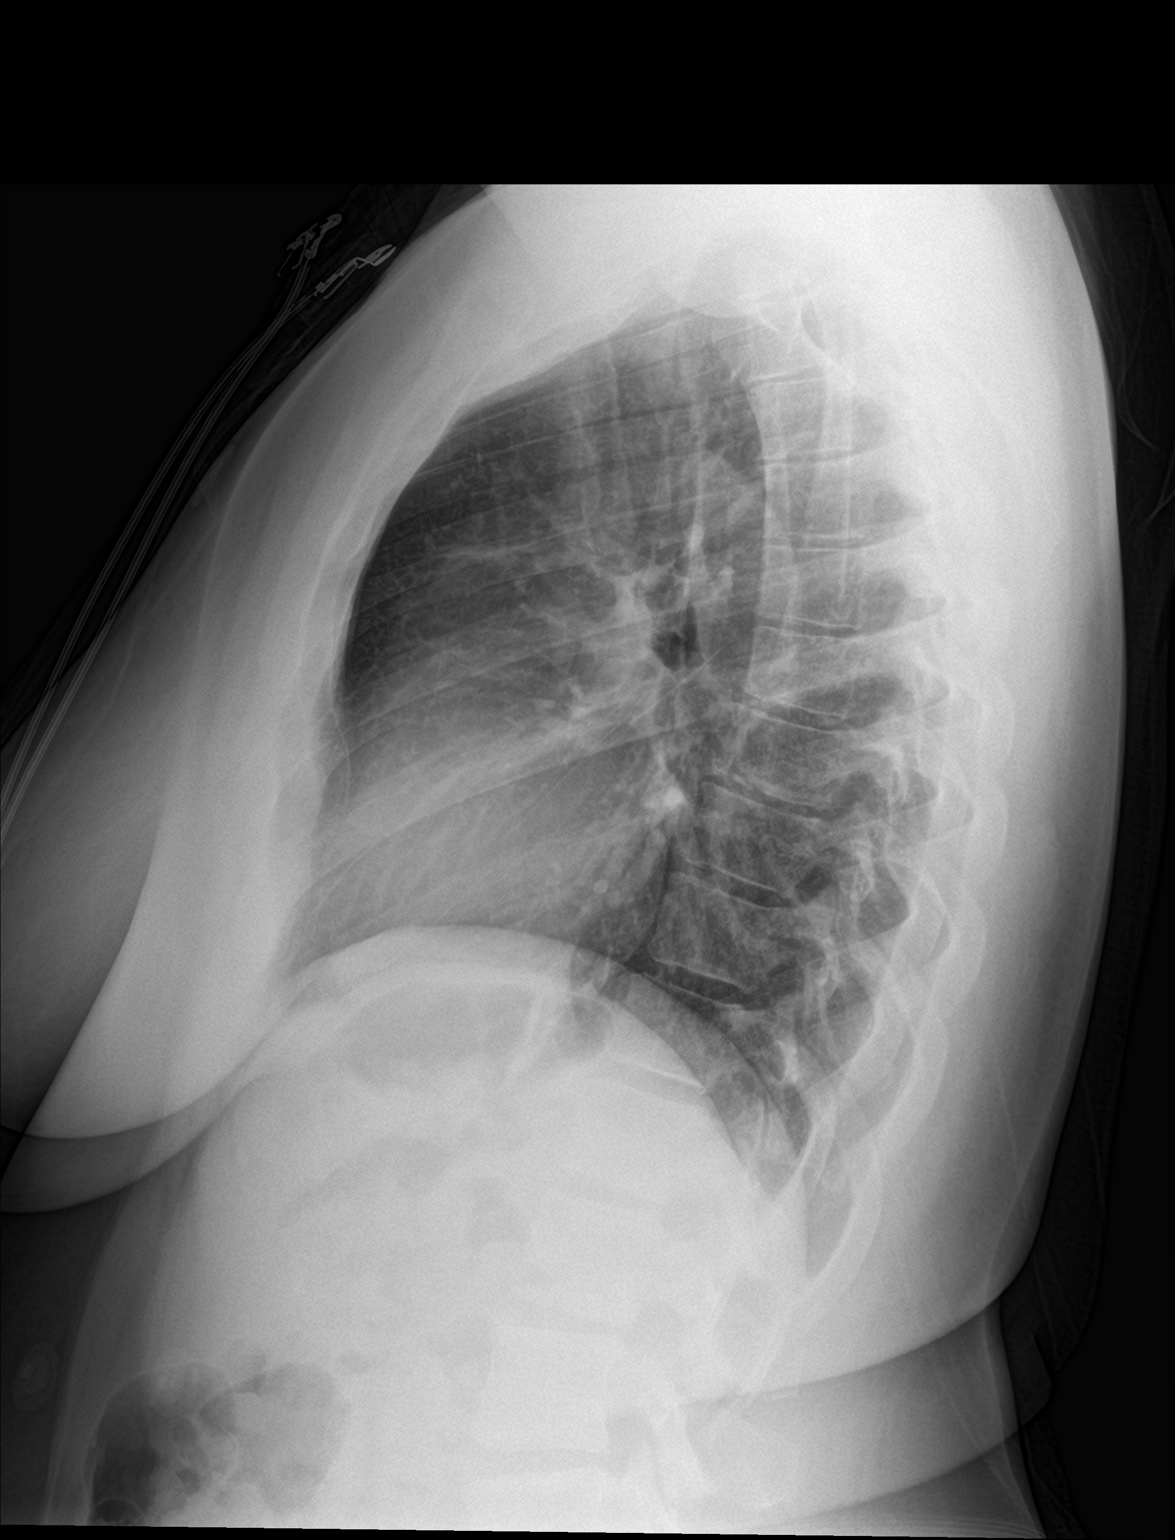

[2 of 2 positions shown; findings below may reference images not displayed]

FINDINGS: The heart size and mediastinal contours are within normal limits.
Both lungs are clear. The visualized skeletal structures are
unremarkable.
IMPRESSION: No active cardiopulmonary disease.

## 2023-01-07 ENCOUNTER — Other Ambulatory Visit: Payer: Self-pay

## 2023-01-07 MED ORDER — WEGOVY 0.5 MG/0.5ML ~~LOC~~ SOAJ
0.5000 mg | SUBCUTANEOUS | 0 refills | Status: DC
  Filled 2023-01-07: qty 2, 28d supply, fill #0

## 2023-01-30 ENCOUNTER — Other Ambulatory Visit: Payer: Self-pay

## 2023-01-30 MED ORDER — WEGOVY 0.5 MG/0.5ML ~~LOC~~ SOAJ
0.5000 mg | SUBCUTANEOUS | 0 refills | Status: DC
  Filled 2023-01-31: qty 2, 28d supply, fill #0

## 2023-01-31 ENCOUNTER — Other Ambulatory Visit: Payer: Self-pay

## 2023-03-14 ENCOUNTER — Encounter (INDEPENDENT_AMBULATORY_CARE_PROVIDER_SITE_OTHER): Payer: Self-pay

## 2023-03-22 ENCOUNTER — Other Ambulatory Visit: Payer: Self-pay

## 2023-03-22 MED ORDER — WEGOVY 1 MG/0.5ML ~~LOC~~ SOAJ
1.0000 mg | SUBCUTANEOUS | 0 refills | Status: DC
  Filled 2023-03-22: qty 2, 28d supply, fill #0

## 2023-04-03 ENCOUNTER — Encounter (INDEPENDENT_AMBULATORY_CARE_PROVIDER_SITE_OTHER): Payer: Self-pay

## 2023-06-23 ENCOUNTER — Emergency Department

## 2023-06-23 ENCOUNTER — Emergency Department
Admission: EM | Admit: 2023-06-23 | Discharge: 2023-06-23 | Disposition: A | Discharge status: Home / Self Care | Attending: Emergency Medicine | Admitting: Emergency Medicine

## 2023-06-23 ENCOUNTER — Other Ambulatory Visit: Payer: Self-pay

## 2023-06-23 ENCOUNTER — Encounter: Payer: Self-pay | Admitting: Emergency Medicine

## 2023-06-23 DIAGNOSIS — R7989 Other specified abnormal findings of blood chemistry: Secondary | ICD-10-CM | POA: Insufficient documentation

## 2023-06-23 DIAGNOSIS — R519 Headache, unspecified: Secondary | ICD-10-CM | POA: Diagnosis present

## 2023-06-23 LAB — CBC WITH DIFFERENTIAL/PLATELET
Abs Immature Granulocytes: 0.01 10*3/uL (ref 0.00–0.07)
Basophils Absolute: 0.1 10*3/uL (ref 0.0–0.1)
Basophils Relative: 1 %
Eosinophils Absolute: 0.1 10*3/uL (ref 0.0–0.5)
Eosinophils Relative: 1 %
HCT: 30 % — ABNORMAL LOW (ref 36.0–46.0)
Hemoglobin: 10.1 g/dL — ABNORMAL LOW (ref 12.0–15.0)
Immature Granulocytes: 0 %
Lymphocytes Relative: 35 %
Lymphs Abs: 2.1 10*3/uL (ref 0.7–4.0)
MCH: 30.5 pg (ref 26.0–34.0)
MCHC: 33.7 g/dL (ref 30.0–36.0)
MCV: 90.6 fL (ref 80.0–100.0)
Monocytes Absolute: 0.5 10*3/uL (ref 0.1–1.0)
Monocytes Relative: 9 %
Neutro Abs: 3.2 10*3/uL (ref 1.7–7.7)
Neutrophils Relative %: 54 %
Platelets: 402 10*3/uL — ABNORMAL HIGH (ref 150–400)
RBC: 3.31 MIL/uL — ABNORMAL LOW (ref 3.87–5.11)
RDW: 12.8 % (ref 11.5–15.5)
WBC: 5.9 10*3/uL (ref 4.0–10.5)
nRBC: 0 % (ref 0.0–0.2)

## 2023-06-23 LAB — URINALYSIS, ROUTINE W REFLEX MICROSCOPIC
Bilirubin Urine: NEGATIVE
Glucose, UA: NEGATIVE mg/dL
Hgb urine dipstick: NEGATIVE
Ketones, ur: NEGATIVE mg/dL
Leukocytes,Ua: NEGATIVE
Nitrite: NEGATIVE
Protein, ur: NEGATIVE mg/dL
Specific Gravity, Urine: 1.001 — ABNORMAL LOW (ref 1.005–1.030)
pH: 6 (ref 5.0–8.0)

## 2023-06-23 LAB — T4, FREE: Free T4: 0.96 ng/dL (ref 0.61–1.12)

## 2023-06-23 LAB — HCG, QUANTITATIVE, PREGNANCY: hCG, Beta Chain, Quant, S: <1 m[IU]/mL (ref <5)

## 2023-06-23 LAB — LIPASE, BLOOD: Lipase: 28 U/L (ref 11–51)

## 2023-06-23 LAB — BASIC METABOLIC PANEL WITH GFR
Anion gap: 7 (ref 5–15)
BUN: 8 mg/dL (ref 6–20)
CO2: 24 mmol/L (ref 22–32)
Calcium: 8.6 mg/dL — ABNORMAL LOW (ref 8.9–10.3)
Chloride: 105 mmol/L (ref 98–111)
Creatinine, Ser: 0.71 mg/dL (ref 0.44–1.00)
GFR, Estimated: >60 mL/min (ref >60)
Glucose, Bld: 96 mg/dL (ref 70–99)
Potassium: 3.6 mmol/L (ref 3.5–5.1)
Sodium: 136 mmol/L (ref 135–145)

## 2023-06-23 LAB — HEPATIC FUNCTION PANEL
ALT: 53 U/L — ABNORMAL HIGH (ref 0–44)
AST: 37 U/L (ref 15–41)
Albumin: 3.7 g/dL (ref 3.5–5.0)
Alkaline Phosphatase: 60 U/L (ref 38–126)
Bilirubin, Direct: <0.1 mg/dL (ref 0.0–0.2)
Total Bilirubin: 0.9 mg/dL (ref 0.0–1.2)
Total Protein: 6.7 g/dL (ref 6.5–8.1)

## 2023-06-23 LAB — RESP PANEL BY RT-PCR (RSV, FLU A&B, COVID)  RVPGX2
Influenza A by PCR: NEGATIVE
Influenza B by PCR: NEGATIVE
Resp Syncytial Virus by PCR: NEGATIVE
SARS Coronavirus 2 by RT PCR: NEGATIVE

## 2023-06-23 LAB — BRAIN NATRIURETIC PEPTIDE: B Natriuretic Peptide: 291.6 pg/mL — ABNORMAL HIGH (ref 0.0–100.0)

## 2023-06-23 LAB — POC URINE PREG, ED: Preg Test, Ur: NEGATIVE

## 2023-06-23 LAB — TSH: TSH: 0.955 u[IU]/mL (ref 0.350–4.500)

## 2023-06-23 LAB — TROPONIN I (HIGH SENSITIVITY): Troponin I (High Sensitivity): 3 ng/L (ref <18)

## 2023-06-23 MED ORDER — ONDANSETRON 4 MG PO TBDP: 4.0000 mg | ORAL_TABLET | Freq: Three times a day (TID) | ORAL | 0 refills | Status: AC | PRN

## 2023-06-23 MED ORDER — KETOROLAC TROMETHAMINE 15 MG/ML IJ SOLN
15.0000 mg | Freq: Once | INTRAMUSCULAR | Status: AC
  Administered 2023-06-23: 15 mg via INTRAVENOUS
  Filled 2023-06-23: qty 1

## 2023-06-23 MED ORDER — ACETAMINOPHEN 500 MG PO TABS
1000.0000 mg | ORAL_TABLET | Freq: Once | ORAL | Status: AC
  Administered 2023-06-23: 1000 mg via ORAL
  Filled 2023-06-23: qty 2

## 2023-06-23 MED ORDER — IOHEXOL 350 MG/ML SOLN
75.0000 mL | Freq: Once | INTRAVENOUS | Status: AC | PRN
  Administered 2023-06-23: 75 mL via INTRAVENOUS

## 2023-06-23 MED ORDER — METOCLOPRAMIDE HCL 5 MG/ML IJ SOLN
10.0000 mg | Freq: Once | INTRAMUSCULAR | Status: AC
  Administered 2023-06-23: 10 mg via INTRAVENOUS
  Filled 2023-06-23: qty 2

## 2023-06-23 MED ORDER — DIPHENHYDRAMINE HCL 50 MG/ML IJ SOLN
12.5000 mg | Freq: Once | INTRAMUSCULAR | Status: AC
  Administered 2023-06-23: 12.5 mg via INTRAVENOUS
  Filled 2023-06-23: qty 1

## 2023-06-23 MED ORDER — SODIUM CHLORIDE 0.9 % IV BOLUS
500.0000 mL | Freq: Once | INTRAVENOUS | Status: AC
  Administered 2023-06-23: 500 mL via INTRAVENOUS

## 2023-06-23 NOTE — Discharge Instructions (Addendum)
 Your workup was reassuring you can take Tylenol  1 g every 8 hours with ibuprofen  600 every 8 hours with food to help with any pain for next week.  You can take Zofran  to help with any nausea from the headaches.  You should follow-up with the below and return to the ER if you develop worsening symptoms or any other concerns   BNP slightly elevated- follow up with cardiology to discuss and consider echocardiogram Hemoglobin slightly low- follow up with PCP for anemia work up IUD/hormone testing-follow up with OB to investigate Call neurology to follow up for headaches   IMPRESSION: Normal CT venogram of the head.  IMPRESSION: 1. Normal noncontrast CT of the head. 2. Normal CTA of the neck. 3. Normal CTA Circle of Willis without significant proximal stenosis, aneurysm, or branch vessel occlusion.

## 2023-06-23 NOTE — ED Provider Notes (Signed)
 [facility] Provider Note    Event Date/Time   First MD Initiated Contact with Patient 06/23/23 1426     (approximate)   History   Headache   HPI  Christina Wu is a 38 y.o. female who comes in with concerns for headache for the past 5 days.  Patient's headache is worsened throughout the day.  Patient does report some episode of blurred vision but denies any vision changes at this time.  Patient reports that the headache gets worse throughout the day but does report that yesterday the headache was at its worst and was the worst headache she is ever had.  However does not really sound like it was sudden yesterday did gradually get worse yesterday.  She does report not sleeping well recently.  She also reports an episode of chest pain yesterday.  She reports that when she kept twisting and different directions her chest was hurting.  She denies any risk factors for PE, does have an IUD and reports the chest pain has resolved, and denies any current shortness of breath.  Patient does report having an IUD but states that it probably needs to be changed.  She did report that yesterday she kind of felt like her body felt swollen.  She reports having a lots of hot flashes.    Physical Exam   Triage Vital Signs: ED Triage Vitals [06/23/23 1357]  Encounter Vitals Group     BP (!) 129/94     Systolic BP Percentile      Diastolic BP Percentile      Pulse Rate 69     Resp 17     Temp 98.8 F (37.1 C)     Temp Source Oral     SpO2 100 %     Weight 190 lb (86.2 kg)     Height 5\' 1"  (1.549 m)     Head Circumference      Peak Flow      Pain Score 8     Pain Loc      Pain Education      Exclude from Growth Chart     Most recent vital signs: Vitals:   06/23/23 1357  BP: (!) 129/94  Pulse: 69  Resp: 17  Temp: 98.8 F (37.1 C)  SpO2: 100%     General: Awake, no distress.  CV:  Good peripheral perfusion.  Resp:  Normal effort.  Abd:  No distention.   Soft and nontender Other:  Cranial nerves appear intact equal strength in arms and legs. No swelling in legs.  No calf tenderness.  ED Results / Procedures / Treatments   Labs (all labs ordered are listed, but only abnormal results are displayed) Labs Reviewed  CBC WITH DIFFERENTIAL/PLATELET - Abnormal; Notable for the following components:      Result Value   RBC 3.31 (*)    Hemoglobin 10.1 (*)    HCT 30.0 (*)    Platelets 402 (*)    All other components within normal limits  BASIC METABOLIC PANEL WITH GFR - Abnormal; Notable for the following components:   Calcium 8.6 (*)    All other components within normal limits     EKG  My interpretation of EKG:  Sinus bradycardia rate of 54 without any ST elevation or T wave inversions  RADIOLOGY I have reviewed the xray personally and interpreted no evidence of any pneumonia   PROCEDURES:  Critical Care performed: No  Procedures   MEDICATIONS ORDERED  IN ED: Medications  metoCLOPramide (REGLAN) injection 10 mg (10 mg Intravenous Given 06/23/23 1602)  diphenhydrAMINE  (BENADRYL ) injection 12.5 mg (12.5 mg Intravenous Given 06/23/23 1602)  sodium chloride 0.9 % bolus 500 mL (0 mLs Intravenous Stopped 06/23/23 1710)  iohexol  (OMNIPAQUE ) 350 MG/ML injection 75 mL (75 mLs Intravenous Contrast Given 06/23/23 1655)     IMPRESSION / MDM / ASSESSMENT AND PLAN / ED COURSE  I reviewed the triage vital signs and the nursing notes.   Patient's presentation is most consistent with acute presentation with potential threat to life or bodily function.   Patient comes in with concerns for multiple things but the biggest concern is her headaches.  Patient reports the headache was really severe yesterday.  Does not sound like it was sudden in onset but she is adamant that she has never had headaches like this before so we will get CT imaging including ruling out to any type of aneurysm, thrombus given she is on IUD.  She denies any current chest  pain, shortness of breath but did report that yesterday she had a chest pain episode and felt like her body was swollen.  Will get markers to evaluate for ACS.  Seems less likely PE given no shortness of breath or chest pain.   Preg test was negative, COVID flu negative urine without evidence of UTI.  CBC does show slightly low hemoglobin she denies any bleeding issues.  BMP reassuring hepatic function shows slight elevation of ALT lipase normal troponin negative  CT imaging was negative.  Reevaluated patient we discussed her slightly elevated BNP and following this up with a cardiologist.  Given she did report some chest pain yesterday I will refer her to cardiology.  She does report seeing cardiology in the past and so she may just follow-up with her past cardiologist.  We also discussed her reassuring CT imaging.  Her headache was getting much improved still slightly present so patient was given some Tylenol , Toradol.  Her hemoglobin was slightly low which can follow this up with her primary care doctor.  For her IUD abdomen soft and nontender and she will follow-up with her OB GYN to discuss hormones and whether or not she needs to have a new IUD placed.  At this time  The patient is on the cardiac monitor to evaluate for evidence of arrhythmia and/or significant heart rate changes.      FINAL CLINICAL IMPRESSION(S) / ED DIAGNOSES   Final diagnoses:  Elevated brain natriuretic peptide (BNP) level  Intractable headache, unspecified chronicity pattern, unspecified headache type     Rx / DC Orders   ED Discharge Orders          Ordered    Ambulatory referral to Cardiology        06/23/23 1817    ondansetron  (ZOFRAN -ODT) 4 MG disintegrating tablet  Every 8 hours PRN        06/23/23 1818             Note:  This document was prepared using Dragon voice recognition software and may include unintentional dictation errors.   Lubertha Rush, MD 06/23/23 [phone]

## 2023-06-23 NOTE — ED Triage Notes (Signed)
 Patient to ED via POV for headache x5 days. Taken OTC meds with no relief. Denies migraine hx. States she has had some blurred vision- none at this time.

## 2023-06-23 NOTE — ED Notes (Signed)
 See triage note  Presents with headache for the past 5 days  States min relief with OTC meds and sleep  The headache increases thur out the day. Denies any fever or n/v  But states her b/p was slightly elevated

## 2023-06-26 ENCOUNTER — Telehealth: Payer: Self-pay

## 2023-06-26 NOTE — Telephone Encounter (Signed)
 Copied from CRM [id number]. Topic: Appointments - Scheduling Inquiry for Clinic >> Jun 26, 2023 11:51 AM Ivette P wrote: Reason for CRM: Pt is a New Pt and had a hospital visit and was discharged 05/04, pt was referred by Nancee Awe to Dr. Saralee Cummins and pt would like to schedule only with Dr. Saralee Cummins  Pt was scheduled for 09/24/23 @  10:20AM but needs to be seen sooner and requesting a follow up to be seen sooner based on condition.

## 2023-06-26 NOTE — Telephone Encounter (Signed)
 Called patient back to set up new patient appt for something sooner per patient request and got no answer unable to leave voicemail. If patient calls back okay to schedule

## 2023-07-03 ENCOUNTER — Ambulatory Visit: Attending: Cardiology | Admitting: Cardiology

## 2023-07-03 ENCOUNTER — Encounter: Payer: Self-pay | Admitting: Cardiology

## 2023-07-03 VITALS — BP 118/77 | Ht 61.0 in | Wt 192.4 lb

## 2023-07-03 DIAGNOSIS — Z6836 Body mass index (BMI) 36.0-36.9, adult: Secondary | ICD-10-CM

## 2023-07-03 DIAGNOSIS — R079 Chest pain, unspecified: Secondary | ICD-10-CM

## 2023-07-03 NOTE — Progress Notes (Signed)
 Cardiology Office Note:    Date:  07/03/2023   ID:  Christina Wu, DOB 1985-08-01, MRN [id number]  PCP:  Susanna Epley, FNP   [facility] Providers Cardiologist:  None     Referring MD: Lubertha Rush, MD   Chief Complaint  Patient presents with   Follow-up    Chest pain and sob with headache felt better after 4 days, hurt to laugh, cough had to elevate head.   Christina Wu is a 38 y.o. female who is being seen today for the evaluation of chest pain at the request of Lubertha Rush, MD.   History of Present Illness:    Christina Wu is a 38 y.o. female with no significant past medical history who presents due to abnormal BNP and chest pain.  Patient presented to the ED 10 days ago due to headache.  Headache lasted about 6 days.  She had persistent chest discomfort over 4 days prior to going to the ED.  Also endorses edema at the time and elevated heart rates.Aaron Aas  BNP obtained in the ED noted to be 291.  Chest x-ray showed clear lungs.  Troponin was normal.  States having some stressors related to her work.  She works in the [employer] system.  Has not had any episodes of chest pain over the past 2 to 3 days.  Denies any personal or family history of heart disease/heart attacks.  Denies smoking.  Past Medical History:  Diagnosis Date   Medical history non-contributory     Past Surgical History:  Procedure Laterality Date   NO PAST SURGERIES      Current Medications: Current Meds  Medication Sig   Multiple Vitamins-Minerals (MULTIVITAMIN WITH MINERALS) tablet Take 1 tablet by mouth daily.     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   Tobacco Use   Smoking status: Never    Smokeless tobacco: Never  Substance and Sexual Activity   Alcohol use: No   Drug use: No   Sexual activity: Yes  Other Topics Concern   Not on file  Social History Narrative   Not on file   Social Drivers of Health   Financial Resource Strain: Not on file  Food Insecurity: Not on file  Transportation Needs: Not on file  Physical Activity: Not on file  Stress: Not on file  Social Connections: Not on file     Family History: The patient's family history includes Diabetes in her mother; Fibroids in her sister; Hypertension in her father, mother, and sister.  ROS:   Please see the history of present illness.     All other systems reviewed and are negative.  EKGs/Labs/Other Studies Reviewed:    The following studies were reviewed today:  EKG Interpretation Date/Time:  Wednesday Jul 03 2023 10:32:48 EDT Ventricular Rate:  68 PR Interval:  146 QRS Duration:  78 QT Interval:  410 QTC Calculation: 435 R Axis:   6  Text Interpretation: Normal sinus rhythm Low voltage QRS Confirmed by Constancia Delton ([id number]) on 07/03/2023 10:33:54 AM    Recent Labs: 06/23/2023: ALT 53; B Natriuretic Peptide 291.6; BUN 8; Creatinine, Ser 0.71; Hemoglobin 10.1; Platelets 402; Potassium 3.6; Sodium  136; TSH 0.955  Recent Lipid Panel No results found for: "CHOL", "TRIG", "HDL", "CHOLHDL", "VLDL", "LDLCALC", "LDLDIRECT"   Risk Assessment/Calculations:              Physical Exam:    VS:  BP 118/77 (BP Location: Left Arm, Patient Position: Sitting, Cuff Size: Normal)   Ht 5\' 1"  (1.549 m)   Wt 192 lb 6.4 oz (87.3 kg)   SpO2 99%   BMI 36.35 kg/m     Wt Readings from Last 3 Encounters:  07/03/23 192 lb 6.4 oz (87.3 kg)  06/23/23 190 lb (86.2 kg)  08/24/21 190 lb (86.2 kg)     GEN: Well nourished, well developed in no acute distress HEENT: Normal NECK: No JVD; No carotid bruits CARDIAC: RRR, no murmurs, rubs, gallops RESPIRATORY:  Clear to auscultation without rales, wheezing or  rhonchi  ABDOMEN: Soft, non-tender, non-distended MUSCULOSKELETAL:  No edema; No deformity  SKIN: Warm and dry NEUROLOGIC:  Alert and oriented x 3 PSYCHIATRIC:  Normal affect   ASSESSMENT:    1. Chest pain of uncertain etiology   2. BMI 36.0-36.9,adult    PLAN:    In order of problems listed above:  Chest pain, appears noncardiac.  Patient has no risk factors, patient is low risk from a cardiac perspective.  Obtain echo.  Chest pain currently resolved.  Reassured patient.  No significant abnormalities noted on echo. Obesity, low-calorie diet, increase activity, weight loss advised.  Elevated BNP is nonspecific, might be secondary to obesity.  Chest x-ray showed clear lungs.  Follow-up after echo      Medication Adjustments/Labs and Tests Ordered: Current medicines are reviewed at length with the patient today.  Concerns regarding medicines are outlined above.  Orders Placed This Encounter  Procedures   EKG 12-Lead   ECHOCARDIOGRAM COMPLETE   No orders of the defined types were placed in this encounter.   Patient Instructions  Medication Instructions:  Your Physician recommend you continue on your current medication as directed.    *If you need a refill on your cardiac medications before your next appointment, please call your pharmacy*  Lab Work: No labs ordered today  If you have labs (blood work) drawn today and your tests are completely normal, you will receive your results only by: MyChart Message (if you have MyChart) OR A paper copy in the mail If you have any lab test that is abnormal or we need to change your treatment, we will call you to review the results.  Testing/Procedures: Your physician has requested that you have an echocardiogram. Echocardiography is a painless test that uses sound waves to create images of your heart. It provides your doctor with information about the size and shape of your heart and how well your heart's chambers and valves are  working.   You may receive an ultrasound enhancing agent through an IV if needed to better visualize your heart during the echo. This procedure takes approximately one hour.  There are no restrictions for this procedure.  This will take place at [address] [facility]) [address], [place] [address]  Please note: We ask at that you not bring children with you during ultrasound (echo/ vascular) testing. Due to room size and safety concerns, children are not allowed in the ultrasound rooms during exams. Our front office staff cannot provide observation of children in our lobby area while testing is being conducted. An adult accompanying a patient to their appointment will only be allowed in the ultrasound  room at the discretion of the ultrasound technician under special circumstances. We apologize for any inconvenience.   Follow-Up: At [facility], you and your health needs are our priority.  As part of our continuing mission to provide you with exceptional heart care, our providers are all part of one team.  This team includes your primary Cardiologist (physician) and Advanced Practice Providers or APPs (Physician Assistants and Nurse Practitioners) who all work together to provide you with the care you need, when you need it.  Your next appointment:   2 month(s)  Provider:   You may see Dr. Junnie Olives or one of the following Advanced Practice Providers on your designated Care Team:   Laneta Pintos, NP Gildardo Labrador, PA-C Varney Gentleman, PA-C Cadence [place], PA-C Ronald Cockayne, NP Morey Ar, NP    We recommend signing up for the patient portal called "MyChart".  Sign up information is provided on this After Visit Summary.  MyChart is used to connect with patients for Virtual Visits (Telemedicine).  Patients are able to view lab/test results, encounter notes, upcoming appointments, etc.  Non-urgent messages can be sent to your provider as well.   To learn more  about what you can do with MyChart, go to [email or web address].         Signed, Constancia Delton, MD  07/03/2023 12:35 PM    [facility]

## 2023-07-03 NOTE — Patient Instructions (Signed)
 Medication Instructions:  Your Physician recommend you continue on your current medication as directed.    *If you need a refill on your cardiac medications before your next appointment, please call your pharmacy*  Lab Work: No labs ordered today  If you have labs (blood work) drawn today and your tests are completely normal, you will receive your results only by: MyChart Message (if you have MyChart) OR A paper copy in the mail If you have any lab test that is abnormal or we need to change your treatment, we will call you to review the results.  Testing/Procedures: Your physician has requested that you have an echocardiogram. Echocardiography is a painless test that uses sound waves to create images of your heart. It provides your doctor with information about the size and shape of your heart and how well your heart's chambers and valves are working.   You may receive an ultrasound enhancing agent through an IV if needed to better visualize your heart during the echo. This procedure takes approximately one hour.  There are no restrictions for this procedure.  This will take place at [address] [facility] [address] [facility]) [address], [place] [address]  Please note: We ask at that you not bring children with you during ultrasound (echo/ vascular) testing. Due to room size and safety concerns, children are not allowed in the ultrasound rooms during exams. Our front office staff cannot provide observation of children in our lobby area while testing is being conducted. An adult accompanying a patient to their appointment will only be allowed in the ultrasound room at the discretion of the ultrasound technician under special circumstances. We apologize for any inconvenience.   Follow-Up: At [facility], you and your health needs are our priority.  As part of our continuing mission to provide you with exceptional heart care, our providers are all part of one team.  This team includes your  primary Cardiologist (physician) and Advanced Practice Providers or APPs (Physician Assistants and Nurse Practitioners) who all work together to provide you with the care you need, when you need it.  Your next appointment:   2 month(s)  Provider:   You may see Dr. Junnie Olives or one of the following Advanced Practice Providers on your designated Care Team:   Laneta Pintos, NP Gildardo Labrador, PA-C Varney Gentleman, PA-C Cadence [place], PA-C Ronald Cockayne, NP Morey Ar, NP    We recommend signing up for the patient portal called "MyChart".  Sign up information is provided on this After Visit Summary.  MyChart is used to connect with patients for Virtual Visits (Telemedicine).  Patients are able to view lab/test results, encounter notes, upcoming appointments, etc.  Non-urgent messages can be sent to your provider as well.   To learn more about what you can do with MyChart, go to [email or web address].

## 2023-07-09 ENCOUNTER — Encounter (INDEPENDENT_AMBULATORY_CARE_PROVIDER_SITE_OTHER): Payer: Self-pay

## 2023-07-12 ENCOUNTER — Ambulatory Visit: Attending: Cardiology

## 2023-07-12 DIAGNOSIS — R079 Chest pain, unspecified: Secondary | ICD-10-CM

## 2023-07-12 LAB — ECHOCARDIOGRAM COMPLETE
AR max vel: 3.56 cm2
AV Area VTI: 3.62 cm2
AV Area mean vel: 3.32 cm2
AV Mean grad: 3 mmHg
AV Peak grad: 6.4 mmHg
Ao pk vel: 1.26 m/s
Area-P 1/2: 4.89 cm2
Calc EF: 68.5 %
S' Lateral: 2.4 cm
Single Plane A2C EF: 65.6 %
Single Plane A4C EF: 69.3 %

## 2023-07-15 ENCOUNTER — Ambulatory Visit: Payer: Self-pay | Admitting: Cardiology

## 2023-08-22 ENCOUNTER — Encounter: Payer: Self-pay | Admitting: Family Medicine

## 2023-08-22 ENCOUNTER — Ambulatory Visit: Admitting: Family Medicine

## 2023-08-22 VITALS — BP 134/84 | HR 61 | Ht 61.0 in | Wt 202.0 lb

## 2023-08-22 DIAGNOSIS — R7989 Other specified abnormal findings of blood chemistry: Secondary | ICD-10-CM

## 2023-08-22 DIAGNOSIS — Z7689 Persons encountering health services in other specified circumstances: Secondary | ICD-10-CM

## 2023-08-22 NOTE — Progress Notes (Signed)
 New patient visit   Patient: Christina Wu   DOB: 01/23/1986   38 y.o. Female  MRN: [id number] Visit Date: 08/22/2023  Today's healthcare provider: Rockie Agent, MD   Chief Complaint  Patient presents with   Establish Care    No concerns   Care Management    Pattern of eating: no meat   Sleep:normal   Are you exercising:Yes  What type of exercising: cardo/tread mill  How long:30-45 min  How frequent: 3 to 4 times a week    Vaccine:       Subjective    Christina Wu is a 38 y.o. female who presents today as a new patient to establish care.   HPI     Establish Care    Additional comments: No concerns        Care Management    Additional comments: Pattern of eating: no meat   Sleep:normal   Are you exercising:Yes  What type of exercising: cardo/tread mill  How long:30-45 min  How frequent: 3 to 4 times a week    Vaccine:          Last edited by Thelbert Eulalio HERO, CMA on 08/22/2023  2:34 PM.       Discussed the use of AI scribe software for clinical note transcription with the patient, who gave verbal consent to proceed.  History of Present Illness Christina Wu is a 38 year old female who presents to establish care as a new patient.  She has no significant past medical history and is not currently on any prescription medications. She takes a multivitamin, zinc, magnesium, calcium, and recently started Nutrafol for hair health. She previously tried Wegovy  but discontinued it due to side effects such as dry mouth and tingling sensations. She has also used phentermine  and meloxicam in the past.  She experienced a recent episode of persistent headaches and elevated blood pressure, which led her to seek emergency care. Her sister, a physician, advised her to get her vitals checked. During the hospital visit, she was found to have elevated blood pressure and elevated BMP markers, prompting further cardiac evaluation including an  echocardiogram. She currently feels well and attributes the episode to stress related to travel and managing her children and work.  She is overweight for her height (5'1) and wants to lose approximately 50 pounds. She is making gradual lifestyle changes such as increasing water intake and adjusting her diet. She finds it challenging to find time for herself amidst her responsibilities.  Family history is significant for diabetes and hypertension, with her mother having diabetes and her entire family having hypertension. She is the only one in her family without hypertension and is conscious of maintaining her health to prevent it.  She has two children, aged 48 and eleven, who are learning Spanish in a dual language program. 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      Past Medical History:  Diagnosis Date   Medical history non-contributory     Outpatient Medications Prior to Visit  Medication Sig   Multiple Vitamins-Minerals (MULTIVITAMIN WITH MINERALS) tablet Take 1 tablet by mouth daily.   [DISCONTINUED] meloxicam (MOBIC) 15 MG tablet Take 15 mg by mouth daily.   [DISCONTINUED] phentermine  15 MG capsule Take 1 capsule (15 mg total) by mouth every morning.   [DISCONTINUED] Semaglutide -Weight Management (WEGOVY ) 0.5 MG/0.5ML SOAJ Inject 0.5 mg into the skin once a week.   [DISCONTINUED] Semaglutide -Weight Management (WEGOVY ) 1  MG/0.5ML SOAJ Inject 1 mg into the skin once a week.   No facility-administered medications prior to visit.    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   Tobacco Use   Smoking status: Never   Smokeless tobacco: Never  Substance and Sexual Activity   Alcohol use: No   Drug use: No   Sexual activity: Yes  Other Topics Concern   Not on file  Social History Narrative   Not on file   Social Drivers of Health   Financial Resource Strain: Low Risk  (08/21/2023)   Overall Financial Resource Strain (CARDIA)    Difficulty of Paying Living Expenses: Not hard at all  Food Insecurity: No Food Insecurity (08/21/2023)   Hunger Vital Sign    Worried About Running Out of Food in the Last Year: Never true    Ran Out of Food in the Last Year: Never true  Transportation Needs: No Transportation Needs (08/21/2023)   PRAPARE - Administrator, Civil Service (Medical): No    Lack of Transportation (Non-Medical): No  Physical Activity: Insufficiently Active (08/21/2023)   Exercise Vital Sign    Days of Exercise per Week: 4 days    Minutes of Exercise per Session: 30 min  Stress: No Stress Concern Present (08/21/2023)   [employer] of Occupational Health - Occupational Stress Questionnaire    Feeling of Stress: Only a little  Social Connections: Socially Integrated (08/21/2023)   Social Connection and Isolation Panel    Frequency of Communication with Friends and Family: More than three times a week    Frequency of Social Gatherings with Friends and Family: More than three times a week    Attends Religious Services: More than 4 times per year    Active Member of Clubs or Organizations: Yes    Attends Engineer, structural: More than 4 times per year    Marital Status: Married     Allergies  Allergen Reactions   White Kidney Bean (Phaseolus Vulgaris) [Phaseolus] Swelling    Immunization History   Administered Date(s) Administered   Influenza,inj,Quad PF,6+ Mos 11/02/2019   PFIZER(Purple Top)SARS-COV-2 Vaccination 04/17/2019, 05/09/2019, 11/28/2019   Pfizer Covid-19 Vaccine Bivalent Booster 12yrs & up 11/24/2020   Rho (D) Immune Globulin  05/21/2012   Tdap 03/02/2019    Health Maintenance  Topic Date Due   Hepatitis B Vaccines (1 of 3 - 19+ 3-dose series) Never done   HPV VACCINES (1 - 3-dose SCDM series) Never done   Cervical Cancer Screening (HPV/Pap Cotest)  Never done   COVID-19 Vaccine (5 - 2024-25 season) 10/21/2022   INFLUENZA VACCINE  09/20/2023   DTaP/Tdap/Td (2 - Td or Tdap) 03/01/2029   Hepatitis C Screening  Completed   HIV Screening  Completed   Meningococcal B Vaccine  Aged Out    Patient Care Team: Sharma Coyer, MD as PCP - General (Family Medicine)  Review of Systems  Last CBC Lab Results  Component Value Date   WBC 5.9 06/23/2023   HGB 10.1 (L) 06/23/2023   HCT 30.0 (L) 06/23/2023   MCV 90.6 06/23/2023   MCH 30.5 06/23/2023   RDW 12.8 06/23/2023   PLT 402 (H) 06/23/2023   Last metabolic panel Lab Results  Component Value Date   GLUCOSE 96 06/23/2023   NA 136 06/23/2023   K 3.6 06/23/2023   CL 105 06/23/2023   CO2 24 06/23/2023   BUN 8 06/23/2023   CREATININE 0.71 06/23/2023   GFRNONAA >60 06/23/2023   CALCIUM 8.6 (L) 06/23/2023   PROT 6.7 06/23/2023   ALBUMIN 3.7 06/23/2023   LABGLOB 2.6 11/24/2018   AGRATIO 1.8 11/24/2018   BILITOT 0.9 06/23/2023   ALKPHOS 60 06/23/2023   AST 37 06/23/2023   ALT 53 (H) 06/23/2023   ANIONGAP 7 06/23/2023   Last lipids No results found for: CHOL, HDL, LDLCALC, LDLDIRECT, TRIG, CHOLHDL Last hemoglobin A1c Lab Results  Component Value Date   HGBA1C 5.3 08/31/2019   Last thyroid  functions Lab Results  Component Value Date   TSH 0.955 06/23/2023   THYROIDAB <9 11/24/2018   Last vitamin D  Lab Results  Component Value Date   VD25OH 21.7 (L) 03/03/2020   Last  vitamin B12 and Folate No results found for: VITAMINB12, FOLATE      Objective    BP 134/84   Pulse 61   Ht 5' 1 (1.549 m)   Wt 202 lb (91.6 kg)   SpO2 99%   BMI 38.17 kg/m  BP Readings from Last 3 Encounters:  08/22/23 134/84  07/03/23 118/77  06/23/23 117/74   Wt Readings from Last 3 Encounters:  08/22/23 202 lb (91.6 kg)  07/03/23 192 lb 6.4 oz (87.3 kg)  06/23/23 190 lb (86.2 kg)        Depression Screen    08/22/2023    2:39 PM 03/02/2019    9:35 AM 11/24/2018    8:44 AM 04/11/2018    9:12 AM  PHQ 2/9 Scores  PHQ - 2 Score 0 0 0 0  PHQ- 9 Score 0      No results found for any visits on 08/22/23.   Physical Exam Vitals reviewed.  Constitutional:      General: She is not in acute distress.    Appearance: Normal appearance. She is not ill-appearing.  Cardiovascular:     Rate and Rhythm: Normal rate and regular rhythm.  Pulmonary:     Effort: Pulmonary effort is normal. No respiratory distress.     Breath sounds: No wheezing, rhonchi or rales.  Neurological:     Mental Status: She is alert and oriented to person, place, and time.  Psychiatric:        Mood and Affect: Mood normal.        Behavior: Behavior normal.    Physical Exam        Assessment & Plan      Problem List Items Addressed This Visit       Other   Low vitamin D  level - Primary   Other Visit Diagnoses       Establishing care with new doctor, encounter for           Assessment & Plan Hypertension Chronic She experienced an episode of elevated blood pressure with headaches and elevated BMP markers, likely  stress-related during a busy period. Blood pressure has normalized without antihypertensive medications. A family history of hypertension increases her risk. She is advised to consider lifestyle modifications to manage stress and prevent future episodes. - Implement lifestyle modifications to manage stress and prevent future episodes  Obesity She is overweight for  her height (5'1) and aims to lose approximately 50 pounds. She discontinued Wegovy  due to side effects and dependency concerns. She is focusing on lifestyle changes, including increased water intake and gradual dietary adjustments, recognizing the need for discipline to achieve weight loss goals. - Encourage continued lifestyle modifications for weight management  General Health Maintenance She is due for vaccinations and screenings. She will schedule a physical examination with fasting labs in September or October, including cholesterol, anemia, and diabetes screening. - Recommend hepatitis B vaccination - Recommend HPV vaccination - Recommend cervical cancer screening - Schedule a physical examination with fasting labs in September or October, including cholesterol, anemia, and diabetes screening      Return in about 2 months (around 10/23/2023) for CPE.      Rockie Agent, MD  [facility] [phone] (phone) [phone] (fax)  [facility]

## 2023-09-02 ENCOUNTER — Ambulatory Visit: Admitting: Cardiology

## 2023-09-02 ENCOUNTER — Ambulatory Visit: Attending: Cardiology | Admitting: Cardiology

## 2023-09-02 ENCOUNTER — Encounter: Payer: Self-pay | Admitting: Cardiology

## 2023-09-02 VITALS — BP 104/60 | HR 90 | Ht 61.0 in | Wt 203.0 lb

## 2023-09-02 DIAGNOSIS — R079 Chest pain, unspecified: Secondary | ICD-10-CM

## 2023-09-02 DIAGNOSIS — Z6838 Body mass index (BMI) 38.0-38.9, adult: Secondary | ICD-10-CM | POA: Diagnosis not present

## 2023-09-02 NOTE — Patient Instructions (Signed)
 Medication Instructions:  Your physician recommends that you continue on your current medications as directed. Please refer to the Current Medication list given to you today.   *If you need a refill on your cardiac medications before your next appointment, please call your pharmacy*  Lab Work: No labs ordered today  If you have labs (blood work) drawn today and your tests are completely normal, you will receive your results only by: MyChart Message (if you have MyChart) OR A paper copy in the mail If you have any lab test that is abnormal or we need to change your treatment, we will call you to review the results.  Testing/Procedures: No test ordered today   Follow-Up: At [facility], you and your health needs are our priority.  As part of our continuing mission to provide you with exceptional heart care, our providers are all part of one team.  This team includes your primary Cardiologist (physician) and Advanced Practice Providers or APPs (Physician Assistants and Nurse Practitioners) who all work together to provide you with the care you need, when you need it.  Your next appointment:   Follow up as needed  Provider:   You may see Dr. Darliss or one of the following Advanced Practice Providers on your designated Care Team:   Lonni Meager, NP Lesley Maffucci, PA-C Bernardino Bring, PA-C Cadence [place], PA-C Tylene Lunch, NP Barnie Hila, NP    We recommend signing up for the patient portal called MyChart.  Sign up information is provided on this After Visit Summary.  MyChart is used to connect with patients for Virtual Visits (Telemedicine).  Patients are able to view lab/test results, encounter notes, upcoming appointments, etc.  Non-urgent messages can be sent to your provider as well.   To learn more about what you can do with MyChart, go to [email or web address].

## 2023-09-02 NOTE — Progress Notes (Signed)
 Cardiology Office Note:    Date:  09/02/2023   ID:  Christina Wu, DOB 1985-11-01, MRN [id number]  PCP:  Sharma Coyer, MD   [facility] Providers Cardiologist:  None     Referring MD: Georgina Speaks, FNP   Chief Complaint  Patient presents with   Follow-up    2 month f/u no complaints today. Meds reviewed verbally with pt.    History of Present Illness:    Christina Wu is a 38 y.o. female with no significant past medical history who presents for follow-up.  She was last seen due to abnormal BNP and chest pain.  Chest pain appears noncardiac, currently resolved.  Echo was obtained to evaluate any significant abnormalities.  Feels well, has no concerns at this time.   Past Medical History:  Diagnosis Date   Medical history non-contributory     Past Surgical History:  Procedure Laterality Date   NO PAST SURGERIES      Current Medications: Current Meds  Medication Sig   BIOTIN PO Take by mouth daily.   Multiple Vitamins-Minerals (MULTIVITAMIN WITH MINERALS) tablet Take 1 tablet by mouth daily.   TURMERIC PO Take by mouth daily.     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   Tobacco Use   Smoking status: Never   Smokeless tobacco: Never  Substance and Sexual Activity   Alcohol use: No   Drug use: No   Sexual activity: Yes  Other Topics Concern   Not on file  Social History Narrative   Not on file   Social Drivers of Health   Financial Resource Strain: Low Risk  (08/21/2023)   Overall Financial Resource Strain (CARDIA)    Difficulty of Paying Living Expenses: Not hard at all  Food Insecurity: No Food Insecurity (08/21/2023)   Hunger Vital Sign    Worried About Running  Out of Food in the Last Year: Never true    Ran Out of Food in the Last Year: Never true  Transportation Needs: No Transportation Needs (08/21/2023)   PRAPARE - Administrator, Civil Service (Medical): No    Lack of Transportation (Non-Medical): No  Physical Activity: Insufficiently Active (08/21/2023)   Exercise Vital Sign    Days of Exercise per Week: 4 days    Minutes of Exercise per Session: 30 min  Stress: No Stress Concern Present (08/21/2023)   [employer] of Occupational Health - Occupational Stress Questionnaire    Feeling of Stress: Only a little  Social Connections: Socially Integrated (08/21/2023)   Social Connection and Isolation Panel    Frequency of Communication with Friends and Family: More than three times a week    Frequency of Social Gatherings with Friends and Family: More than three times a week    Attends Religious Services: More than 4 times per year    Active Member of [employer] or Organizations: Yes    Attends Engineer, structural: More than 4 times per year    Marital Status: Married     Family History: The patient's family history includes Diabetes in her mother; Fibroids in her sister; Hypertension in her father, mother, and sister.  ROS:   Please see the history  of present illness.     All other systems reviewed and are negative.  EKGs/Labs/Other Studies Reviewed:    The following studies were reviewed today:       Recent Labs: 06/23/2023: ALT 53; B Natriuretic Peptide 291.6; BUN 8; Creatinine, Ser 0.71; Hemoglobin 10.1; Platelets 402; Potassium 3.6; Sodium 136; TSH 0.955  Recent Lipid Panel No results found for: CHOL, TRIG, HDL, CHOLHDL, VLDL, LDLCALC, LDLDIRECT   Risk Assessment/Calculations:              Physical Exam:    VS:  BP 104/60 (BP Location: Left Arm, Patient Position: Sitting, Cuff Size: Large)   Pulse 90   Ht 5' 1 (1.549 m)   Wt 203 lb (92.1 kg)   SpO2 98%   BMI 38.36 kg/m     Wt Readings  from Last 3 Encounters:  09/02/23 203 lb (92.1 kg)  08/22/23 202 lb (91.6 kg)  07/03/23 192 lb 6.4 oz (87.3 kg)     GEN: Well nourished, well developed in no acute distress HEENT: Normal NECK: No JVD; No carotid bruits CARDIAC: RRR, no murmurs, rubs, gallops RESPIRATORY:  Clear to auscultation without rales, wheezing or rhonchi  ABDOMEN: Soft, non-tender, non-distended MUSCULOSKELETAL:  No edema; No deformity  SKIN: Warm and dry NEUROLOGIC:  Alert and oriented x 3 PSYCHIATRIC:  Normal affect   ASSESSMENT:    1. Chest pain of uncertain etiology   2. BMI 38.0-38.9,adult    PLAN:    In order of problems listed above:  Chest pain, appears noncardiac.  Has no cardiac risk factors.  Patient has no risk factors, patient is low risk from a cardiac perspective.  Echo 06/2023 normal systolic and diastolic function, EF 60 to 65%. Chest pain currently resolved.   Obesity, low-calorie diet, weight loss advised.  Follow-up as needed     Medication Adjustments/Labs and Tests Ordered: Current medicines are reviewed at length with the patient today.  Concerns regarding medicines are outlined above.  No orders of the defined types were placed in this encounter.  No orders of the defined types were placed in this encounter.   Patient Instructions  Medication Instructions:  Your physician recommends that you continue on your current medications as directed. Please refer to the Current Medication list given to you today.   *If you need a refill on your cardiac medications before your next appointment, please call your pharmacy*  Lab Work: No labs ordered today  If you have labs (blood work) drawn today and your tests are completely normal, you will receive your results only by: MyChart Message (if you have MyChart) OR A paper copy in the mail If you have any lab test that is abnormal or we need to change your treatment, we will call you to review the results.  Testing/Procedures: No  test ordered today   Follow-Up: At [facility], you and your health needs are our priority.  As part of our continuing mission to provide you with exceptional heart care, our providers are all part of one team.  This team includes your primary Cardiologist (physician) and Advanced Practice Providers or APPs (Physician Assistants and Nurse Practitioners) who all work together to provide you with the care you need, when you need it.  Your next appointment:   Follow up as needed  Provider:   You may see Dr. Darliss or one of the following Advanced Practice Providers on your designated Care Team:   Lonni Meager, NP Lesley Maffucci, PA-C Bernardino Bring,  PA-C Cadence [place], PA-C Tylene Lunch, NP Barnie Hila, NP    We recommend signing up for the patient portal called MyChart.  Sign up information is provided on this After Visit Summary.  MyChart is used to connect with patients for Virtual Visits (Telemedicine).  Patients are able to view lab/test results, encounter notes, upcoming appointments, etc.  Non-urgent messages can be sent to your provider as well.   To learn more about what you can do with MyChart, go to [email or web address].         Signed, Redell Cave, MD  09/02/2023 3:17 PM    [facility]

## 2023-09-24 ENCOUNTER — Ambulatory Visit: Admitting: Family Medicine

## 2023-10-24 ENCOUNTER — Encounter: Admitting: Family Medicine

## 2024-02-04 ENCOUNTER — Other Ambulatory Visit: Payer: Self-pay

## 2024-02-04 ENCOUNTER — Other Ambulatory Visit (HOSPITAL_COMMUNITY): Payer: Self-pay

## 2024-02-04 ENCOUNTER — Telehealth: Payer: Self-pay

## 2024-02-04 ENCOUNTER — Encounter: Payer: Self-pay | Admitting: Family Medicine

## 2024-02-04 ENCOUNTER — Telehealth: Admitting: Family Medicine

## 2024-02-04 VITALS — Ht 61.0 in | Wt 201.0 lb

## 2024-02-04 DIAGNOSIS — E66812 Obesity, class 2: Secondary | ICD-10-CM | POA: Insufficient documentation

## 2024-02-04 MED ORDER — ZEPBOUND 5 MG/0.5ML ~~LOC~~ SOAJ
5.0000 mg | SUBCUTANEOUS | 2 refills | Status: AC
  Filled 2024-02-04: qty 3, 42d supply, fill #0
  Filled 2024-03-02 – 2024-03-04 (×3): qty 2, 28d supply, fill #0
  Filled 2024-03-27: qty 2, 28d supply, fill #1

## 2024-02-04 MED ORDER — ZEPBOUND 2.5 MG/0.5ML ~~LOC~~ SOAJ
2.5000 mg | SUBCUTANEOUS | 0 refills | Status: AC
  Filled 2024-02-04 – 2024-02-05 (×2): qty 2, 28d supply, fill #0

## 2024-02-04 NOTE — Patient Instructions (Signed)
 To keep you healthy, please keep in mind the following health maintenance items that you are due for:   Health Maintenance Due  Topic Date Due   Hepatitis B Vaccines 19-59 Average Risk (1 of 3 - 19+ 3-dose series) Never done   HPV VACCINES (1 - 3-dose SCDM series) Never done   Cervical Cancer Screening (HPV/Pap Cotest)  Never done   Influenza Vaccine  09/20/2023   COVID-19 Vaccine (5 - 2025-26 season) 10/21/2023   Mammogram  02/02/2024     Best Wishes,   Dr. Lang

## 2024-02-04 NOTE — Telephone Encounter (Signed)
 Pharmacy Patient Advocate Encounter   Received notification from cc'd request msgs that prior authorization for tirzepatide  (ZEPBOUND ) 2.5 MG/0.5ML Pen  is required/requested.   Insurance verification completed.   The patient is insured through [facility].   Per test claim: PA required; PA submitted to above mentioned insurance via Latent Key/confirmation #/EOC [id number] Status is pending

## 2024-02-04 NOTE — Assessment & Plan Note (Signed)
 Obesity, class 2 Chronic  Class 2 obesity with a BMI of 30. Previous weight management attempts included Wegovy , which was discontinued due to side effects such as a metallic taste and altered taste sensation. Phentermine  was ineffective. She is interested in trying Zepbound , a different mechanism for weight loss, and is willing to tolerate potential side effects such as nausea and constipation, which often resolve over time. She is also focusing on lifestyle changes, including increased water intake and dietary adjustments, and has reintroduced fish into her diet. She engages in moderate exercise 2-3 times a week and is encouraged to increase this to 200-240 minutes per week, including resistance training to maintain muscle mass during weight loss. - Ordered A1c, thyroid  level, and metabolic panel to assess current health status. - Prescribed Zepbound  2.5 mg once a week for weight management. - Sent prescription to pharmacy for prior authorization. - Advised to increase water intake and use Miralax 1-2 times a day if needed for constipation. - Encouraged moderate intensity exercise 200-240 minutes per week, including resistance training. - Scheduled follow-up after 3 months to assess response to Zepbound  and adjust dosage if necessary.

## 2024-02-04 NOTE — Progress Notes (Signed)
 MyChart Video Visit    Virtual Visit via Video Note   This format is felt to be most appropriate for this patient at this time. Physical exam was limited by quality of the video and audio technology used for the visit.   Patient location: Patient's home address   Provider location: [facility]  [address]  [place], [place] [address]   I discussed the limitations of evaluation and management by telemedicine and the availability of in person appointments. The patient expressed understanding and agreed to proceed.  Patient: Christina Wu   DOB: [birth year]   38 y.o. Female  MRN: [id number] Visit Date: 02/04/2024  Today's healthcare provider: Rockie Agent, MD   No chief complaint on file.  Subjective    HPI   Discussed the use of AI scribe software for clinical note transcription with the patient, who gave verbal consent to proceed.  History of Present Illness Christina Wu is a 38 year old female with a history of obesity who presents for weight management consultation.  She is interested in restarting weight management medication, specifically Zepbound , after previously using Wegovy  and phentermine . Wegovy  was discontinued due to side effects such as a metallic taste and lack of taste, while phentermine  was ineffective. Her weight has been fluctuating between 198 and 201 pounds, and she is 5'1 tall.  Her goal is to lose 50 pounds, and she has been focusing on lifestyle changes, including increased water intake and dietary adjustments. She has recently reintroduced fish into her diet after not eating meat for three years, previously relying on protein shakes, beans, lentils, and tofu. She is not planning to reintroduce chicken or red meat at this time.  Her exercise routine includes walking on a treadmill two to three times a week, and she is active at work, moving around regularly.  There is no family history of thyroid  cancer,  and she has not reported any significant side effects from previous weight management medications other than those mentioned.      Past Medical History:  Diagnosis Date   Medical history non-contributory        08/22/2023    2:39 PM 03/02/2019    9:35 AM 11/24/2018    8:44 AM  PHQ9 SCORE ONLY  PHQ-9 Total Score 0  0  0      Data saved with a previous flowsheet row definition       08/22/2023    2:39 PM  GAD 7 : Generalized Anxiety Score  Nervous, Anxious, on Edge 0  Control/stop worrying 0  Worry too much - different things 0  Trouble relaxing 0  Restless 0  Easily annoyed or irritable 0  Afraid - awful might happen 0  Total GAD 7 Score 0  Anxiety Difficulty Not difficult at all     Medications: Show/hide medication list[1]  Review of Systems  Last metabolic panel Lab Results  Component Value Date   GLUCOSE 96 06/23/2023   NA 136 06/23/2023   K 3.6 06/23/2023   CL 105 06/23/2023   CO2 24 06/23/2023   BUN 8 06/23/2023   CREATININE 0.71 06/23/2023   GFRNONAA >60 06/23/2023   CALCIUM 8.6 (L) 06/23/2023   PROT 6.7 06/23/2023   ALBUMIN 3.7 06/23/2023   LABGLOB 2.6 11/24/2018   AGRATIO 1.8 11/24/2018   BILITOT 0.9 06/23/2023   ALKPHOS 60 06/23/2023   AST 37 06/23/2023   ALT 53 (H) 06/23/2023   ANIONGAP 7 06/23/2023   Last lipids  No results found for: CHOL, HDL, LDLCALC, LDLDIRECT, TRIG, CHOLHDL Last hemoglobin A1c Lab Results  Component Value Date   HGBA1C 5.3 08/31/2019   Last thyroid  functions Lab Results  Component Value Date   TSH 0.955 06/23/2023   FREET4 0.96 06/23/2023   THYROIDAB <9 11/24/2018        Objective    Ht 5' 1 (1.549 m)   Wt 201 lb (91.2 kg)   BMI 37.98 kg/m   BP Readings from Last 3 Encounters:  09/02/23 104/60  08/22/23 134/84  07/03/23 118/77   Wt Readings from Last 3 Encounters:  02/04/24 201 lb (91.2 kg)  09/02/23 203 lb (92.1 kg)  08/22/23 202 lb (91.6 kg)       Physical Exam Vitals  reviewed.  Constitutional:      General: She is not in acute distress.    Appearance: Normal appearance. She is not ill-appearing.  Pulmonary:     Effort: Pulmonary effort is normal. No respiratory distress.  Neurological:     Mental Status: She is alert and oriented to person, place, and time.  Psychiatric:        Mood and Affect: Mood normal.        Behavior: Behavior normal.        Thought Content: Thought content normal.     Physical Exam MEASUREMENTS: Height- 5'1, Weight- 198-201 lbs, BMI- 30.0.     Assessment & Plan     Problem List Items Addressed This Visit     Obesity, Class II, BMI 35-39.9 - Primary   Obesity, class 2 Chronic  Class 2 obesity with a BMI of 30. Previous weight management attempts included Wegovy , which was discontinued due to side effects such as a metallic taste and altered taste sensation. Phentermine  was ineffective. She is interested in trying Zepbound , a different mechanism for weight loss, and is willing to tolerate potential side effects such as nausea and constipation, which often resolve over time. She is also focusing on lifestyle changes, including increased water intake and dietary adjustments, and has reintroduced fish into her diet. She engages in moderate exercise 2-3 times a week and is encouraged to increase this to 200-240 minutes per week, including resistance training to maintain muscle mass during weight loss. - Ordered A1c, thyroid  level, and metabolic panel to assess current health status. - Prescribed Zepbound  2.5 mg once a week for weight management. - Sent prescription to pharmacy for prior authorization. - Advised to increase water intake and use Miralax 1-2 times a day if needed for constipation. - Encouraged moderate intensity exercise 200-240 minutes per week, including resistance training. - Scheduled follow-up after 3 months to assess response to Zepbound  and adjust dosage if necessary.      Relevant Medications    tirzepatide  (ZEPBOUND ) 2.5 MG/0.5ML Pen   tirzepatide  (ZEPBOUND ) 5 MG/0.5ML Pen (Start on 03/05/2024)   Other Relevant Orders   CMP14+EGFR   Hemoglobin A1c   TSH + free T4    Assessment and Plan Assessment & Plan   General Health Maintenance Routine health maintenance discussed, including the importance of regular exercise and a balanced diet to support overall health and weight management. - Encouraged regular exercise and balanced diet.     Return in about 3 months (around 05/04/2024) for Weight MGMT (zepbound ).     I discussed the assessment and treatment plan with the patient. The patient was provided an opportunity to ask questions and all were answered. The patient agreed with the plan and demonstrated an  understanding of the instructions.   The patient was advised to call back or seek an in-person evaluation if the symptoms worsen or if the condition fails to improve as anticipated.  I provided 25 minutes of non-face-to-face time during this encounter.   Rockie Agent, MD [facility] [phone] (phone) [phone] (fax)  [facility]     [1]  Outpatient Medications Prior to Visit  Medication Sig   BIOTIN PO Take by mouth daily.   Multiple Vitamins-Minerals (MULTIVITAMIN WITH MINERALS) tablet Take 1 tablet by mouth daily.   TURMERIC PO Take by mouth daily.   No facility-administered medications prior to visit.

## 2024-02-05 ENCOUNTER — Other Ambulatory Visit (HOSPITAL_COMMUNITY): Payer: Self-pay

## 2024-02-05 ENCOUNTER — Other Ambulatory Visit: Payer: Self-pay

## 2024-02-05 NOTE — Telephone Encounter (Signed)
 Pharmacy Patient Advocate Encounter  Received notification from [facility] that Prior Authorization for (ZEPBOUND ) 2.5 MG/0.5ML Pen  has been APPROVED to 6.16.26. Ran test claim, Copay is $RX HAS NOW BEEN FILLED AS OF TODAY 12/17. This test claim was processed through [facility]- copay amounts may vary at other pharmacies due to pharmacy/plan contracts, or as the patient moves through the different stages of their insurance plan.

## 2024-03-02 ENCOUNTER — Other Ambulatory Visit: Payer: Self-pay

## 2024-03-03 ENCOUNTER — Other Ambulatory Visit: Payer: Self-pay

## 2024-03-04 ENCOUNTER — Other Ambulatory Visit: Payer: Self-pay

## 2024-03-06 ENCOUNTER — Other Ambulatory Visit: Payer: Self-pay

## 2024-03-27 ENCOUNTER — Other Ambulatory Visit: Payer: Self-pay

## 2024-05-06 ENCOUNTER — Ambulatory Visit: Admitting: Family Medicine
